# Patient Record
Sex: Male | Born: 1980 | Race: Black or African American | Hispanic: No | Marital: Single | Smoking: Current every day smoker
Health system: Southern US, Community
[De-identification: ages and names within clinical notes are randomized; demographics above are authoritative.]

## PROBLEM LIST (undated history)

## (undated) DIAGNOSIS — R7303 Prediabetes: Secondary | ICD-10-CM

## (undated) DIAGNOSIS — K219 Gastro-esophageal reflux disease without esophagitis: Secondary | ICD-10-CM

## (undated) DIAGNOSIS — Z973 Presence of spectacles and contact lenses: Secondary | ICD-10-CM

## (undated) DIAGNOSIS — J329 Chronic sinusitis, unspecified: Secondary | ICD-10-CM

## (undated) HISTORY — DX: Prediabetes: R73.03

## (undated) HISTORY — PX: WISDOM TOOTH EXTRACTION: SHX21

---
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
## 2000-06-14 ENCOUNTER — Emergency Department (HOSPITAL_COMMUNITY): Admission: EM | Admit: 2000-06-14 | Discharge: 2000-06-14 | Payer: Self-pay | Admitting: Emergency Medicine

## 2004-04-28 ENCOUNTER — Encounter: Admission: RE | Admit: 2004-04-28 | Discharge: 2004-04-28 | Payer: Self-pay | Admitting: Internal Medicine

## 2004-09-23 ENCOUNTER — Ambulatory Visit: Payer: Self-pay | Admitting: Internal Medicine

## 2005-09-19 ENCOUNTER — Ambulatory Visit: Payer: Self-pay | Admitting: Hospitalist

## 2005-09-20 ENCOUNTER — Ambulatory Visit: Payer: Self-pay | Admitting: Internal Medicine

## 2005-12-13 ENCOUNTER — Emergency Department (HOSPITAL_COMMUNITY): Admission: EM | Admit: 2005-12-13 | Discharge: 2005-12-13 | Payer: Self-pay | Admitting: Family Medicine

## 2006-12-14 ENCOUNTER — Emergency Department (HOSPITAL_COMMUNITY): Admission: EM | Admit: 2006-12-14 | Discharge: 2006-12-14 | Payer: Self-pay | Admitting: Family Medicine

## 2007-02-25 ENCOUNTER — Emergency Department (HOSPITAL_COMMUNITY): Admission: EM | Admit: 2007-02-25 | Discharge: 2007-02-25 | Payer: Self-pay | Admitting: Family Medicine

## 2007-05-23 ENCOUNTER — Emergency Department (HOSPITAL_COMMUNITY): Admission: EM | Admit: 2007-05-23 | Discharge: 2007-05-23 | Payer: Self-pay | Admitting: Family Medicine

## 2010-02-02 ENCOUNTER — Emergency Department (HOSPITAL_COMMUNITY): Admission: EM | Admit: 2010-02-02 | Discharge: 2010-02-02 | Payer: Self-pay | Admitting: Emergency Medicine

## 2010-03-17 ENCOUNTER — Ambulatory Visit: Payer: Self-pay | Admitting: Internal Medicine

## 2010-03-17 ENCOUNTER — Encounter: Payer: Self-pay | Admitting: Internal Medicine

## 2010-03-17 DIAGNOSIS — F17200 Nicotine dependence, unspecified, uncomplicated: Secondary | ICD-10-CM | POA: Insufficient documentation

## 2010-03-17 DIAGNOSIS — M7660 Achilles tendinitis, unspecified leg: Secondary | ICD-10-CM | POA: Insufficient documentation

## 2010-03-17 DIAGNOSIS — Z720 Tobacco use: Secondary | ICD-10-CM | POA: Insufficient documentation

## 2010-03-17 DIAGNOSIS — M79609 Pain in unspecified limb: Secondary | ICD-10-CM | POA: Insufficient documentation

## 2010-03-17 LAB — CONVERTED CEMR LAB
Chlamydia, Swab/Urine, PCR: NEGATIVE
GC Probe Amp, Urine: NEGATIVE
HCV Ab: NEGATIVE
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
## 2010-12-22 ENCOUNTER — Emergency Department (HOSPITAL_COMMUNITY): Payer: Self-pay

## 2010-12-22 ENCOUNTER — Emergency Department (HOSPITAL_COMMUNITY)
Admission: EM | Admit: 2010-12-22 | Discharge: 2010-12-22 | Disposition: A | Discharge status: Home / Self Care | Payer: Self-pay | Attending: Emergency Medicine | Admitting: Emergency Medicine

## 2010-12-22 ENCOUNTER — Encounter (HOSPITAL_COMMUNITY): Payer: Self-pay | Admitting: Radiology

## 2010-12-22 DIAGNOSIS — R29810 Facial weakness: Secondary | ICD-10-CM | POA: Insufficient documentation

## 2010-12-22 DIAGNOSIS — H538 Other visual disturbances: Secondary | ICD-10-CM | POA: Insufficient documentation

## 2010-12-22 DIAGNOSIS — G510 Bell's palsy: Secondary | ICD-10-CM | POA: Insufficient documentation

## 2010-12-22 DIAGNOSIS — J3489 Other specified disorders of nose and nasal sinuses: Secondary | ICD-10-CM | POA: Insufficient documentation

## 2010-12-22 DIAGNOSIS — R51 Headache: Secondary | ICD-10-CM | POA: Insufficient documentation

## 2010-12-22 DIAGNOSIS — R110 Nausea: Secondary | ICD-10-CM | POA: Insufficient documentation

## 2010-12-22 DIAGNOSIS — R4789 Other speech disturbances: Secondary | ICD-10-CM | POA: Insufficient documentation

## 2010-12-22 DIAGNOSIS — I10 Essential (primary) hypertension: Secondary | ICD-10-CM | POA: Insufficient documentation

## 2010-12-22 DIAGNOSIS — R42 Dizziness and giddiness: Secondary | ICD-10-CM | POA: Insufficient documentation

## 2010-12-22 DIAGNOSIS — R070 Pain in throat: Secondary | ICD-10-CM | POA: Insufficient documentation

## 2010-12-22 DIAGNOSIS — H5789 Other specified disorders of eye and adnexa: Secondary | ICD-10-CM | POA: Insufficient documentation

## 2010-12-22 DIAGNOSIS — R5381 Other malaise: Secondary | ICD-10-CM | POA: Insufficient documentation

## 2010-12-22 LAB — POCT I-STAT, CHEM 8
BUN: 22 mg/dL (ref 6–23)
Calcium, Ion: 1.07 mmol/L — ABNORMAL LOW (ref 1.12–1.32)
Chloride: 108 mEq/L (ref 96–112)
Creatinine, Ser: 1.3 mg/dL (ref 0.4–1.5)
Glucose, Bld: 97 mg/dL (ref 70–99)
HCT: 48 % (ref 39.0–52.0)
Hemoglobin: 16.3 g/dL (ref 13.0–17.0)
Potassium: 4 mEq/L (ref 3.5–5.1)
Sodium: 139 mEq/L (ref 135–145)
TCO2: 22 mmol/L (ref 0–100)

## 2010-12-24 ENCOUNTER — Telehealth: Payer: Self-pay | Admitting: *Deleted

## 2010-12-24 NOTE — Telephone Encounter (Signed)
According to ED notes pt was given prednisone 20 mg  3 tabs daily for 7 days. Should I tell him not to worry about the antibiotic?

## 2010-12-24 NOTE — Telephone Encounter (Signed)
Pt states he was seen in ED on 2/1 and treated for Bell's Palsy.  He was given  valacyclovir 500 mg  # 42 He can't afford this as cost is over $200.  Can you give him anything else? [facility] [address].

## 2010-12-24 NOTE — Telephone Encounter (Signed)
He needs to take the prednisone. If severe facial palsy, should try to get the valcylovir. If eye involvement, needs eye care - taping, eye drops, etc.

## 2010-12-24 NOTE — Telephone Encounter (Signed)
Reviewed the UpToDate article. Valacyclovir was the only recommended antiviral. Acylovir was tested but not beneficial. Antivirals only recom with rather severe facial weakness. Was he Rx steroids? That is the most beneficial tx.

## 2010-12-24 NOTE — Telephone Encounter (Signed)
Pt has been informed. He is seeing neurologist for f/u and will make appointment to come into clinic. He was given eye drops.

## 2013-06-26 ENCOUNTER — Encounter (HOSPITAL_COMMUNITY): Payer: Self-pay | Admitting: *Deleted

## 2013-06-26 ENCOUNTER — Emergency Department (HOSPITAL_COMMUNITY)
Admission: EM | Admit: 2013-06-26 | Discharge: 2013-06-26 | Disposition: A | Discharge status: Home / Self Care | Payer: Self-pay | Source: Home / Self Care

## 2013-06-26 DIAGNOSIS — B86 Scabies: Secondary | ICD-10-CM

## 2013-06-26 MED ORDER — HYDROXYZINE HCL 25 MG PO TABS: 25.0000 mg | ORAL_TABLET | Freq: Three times a day (TID) | ORAL | Status: DC | PRN

## 2013-06-26 MED ORDER — PERMETHRIN 5 % EX CREA: TOPICAL_CREAM | CUTANEOUS | Status: DC

## 2013-06-26 MED ORDER — TRIAMCINOLONE ACETONIDE 0.1 % EX CREA: TOPICAL_CREAM | Freq: Two times a day (BID) | CUTANEOUS | Status: DC

## 2013-06-26 NOTE — ED Notes (Signed)
Pt       Reports     Symptoms     Of a  Rash        For  About  1   Month     The    Rash  Is  On  His  Hands  And  The  Rash  Itches       He  Displays  No  Angioedema  And  Is  In no  Acute  Distress

## 2015-11-10 ENCOUNTER — Encounter (HOSPITAL_COMMUNITY): Payer: Self-pay | Admitting: Emergency Medicine

## 2015-11-10 ENCOUNTER — Emergency Department (HOSPITAL_COMMUNITY)
Admission: EM | Admit: 2015-11-10 | Discharge: 2015-11-10 | Disposition: A | Discharge status: Home / Self Care | Payer: 59 | Attending: Emergency Medicine | Admitting: Emergency Medicine

## 2015-11-10 DIAGNOSIS — S4992XA Unspecified injury of left shoulder and upper arm, initial encounter: Secondary | ICD-10-CM | POA: Insufficient documentation

## 2015-11-10 DIAGNOSIS — S199XXA Unspecified injury of neck, initial encounter: Secondary | ICD-10-CM | POA: Insufficient documentation

## 2015-11-10 DIAGNOSIS — S299XXA Unspecified injury of thorax, initial encounter: Secondary | ICD-10-CM | POA: Insufficient documentation

## 2015-11-10 DIAGNOSIS — T148 Other injury of unspecified body region: Secondary | ICD-10-CM | POA: Insufficient documentation

## 2015-11-10 DIAGNOSIS — F17210 Nicotine dependence, cigarettes, uncomplicated: Secondary | ICD-10-CM | POA: Diagnosis not present

## 2015-11-10 DIAGNOSIS — R091 Pleurisy: Secondary | ICD-10-CM | POA: Diagnosis present

## 2015-11-10 DIAGNOSIS — Y9289 Other specified places as the place of occurrence of the external cause: Secondary | ICD-10-CM | POA: Insufficient documentation

## 2015-11-10 DIAGNOSIS — X58XXXA Exposure to other specified factors, initial encounter: Secondary | ICD-10-CM | POA: Diagnosis not present

## 2015-11-10 DIAGNOSIS — S4991XA Unspecified injury of right shoulder and upper arm, initial encounter: Secondary | ICD-10-CM | POA: Insufficient documentation

## 2015-11-10 DIAGNOSIS — Y9389 Activity, other specified: Secondary | ICD-10-CM | POA: Diagnosis not present

## 2015-11-10 DIAGNOSIS — Y998 Other external cause status: Secondary | ICD-10-CM | POA: Diagnosis not present

## 2015-11-10 DIAGNOSIS — T148XXA Other injury of unspecified body region, initial encounter: Secondary | ICD-10-CM

## 2015-11-10 LAB — CK: CK TOTAL: 190 U/L (ref 49–397)

## 2015-11-10 MED ORDER — OXYCODONE-ACETAMINOPHEN 5-325 MG PO TABS
1.0000 | ORAL_TABLET | Freq: Once | ORAL | Status: AC
  Administered 2015-11-10: 1 via ORAL
  Filled 2015-11-10: qty 1

## 2015-11-10 MED ORDER — OXYCODONE-ACETAMINOPHEN 5-325 MG PO TABS: 1.0000 | ORAL_TABLET | Freq: Four times a day (QID) | ORAL | Status: DC | PRN

## 2015-11-10 MED ORDER — CYCLOBENZAPRINE HCL 10 MG PO TABS: 10.0000 mg | ORAL_TABLET | Freq: Two times a day (BID) | ORAL | Status: DC | PRN

## 2015-11-10 MED ORDER — IBUPROFEN 600 MG PO TABS: 600.0000 mg | ORAL_TABLET | Freq: Four times a day (QID) | ORAL | Status: DC | PRN

## 2015-11-10 NOTE — ED Notes (Addendum)
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 Entire upper chest and bilateral shoulder pain worse when woke up this morning. Tender to palpation; worse with movement. 7/10 pain. Heating pads applied. Smoker; denies other medical issues.

## 2015-11-10 NOTE — Discharge Instructions (Signed)
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
## 2016-09-30 ENCOUNTER — Ambulatory Visit (HOSPITAL_COMMUNITY)
Admission: EM | Admit: 2016-09-30 | Discharge: 2016-09-30 | Disposition: A | Discharge status: Home / Self Care | Payer: 59 | Attending: Emergency Medicine | Admitting: Emergency Medicine

## 2016-09-30 ENCOUNTER — Encounter (HOSPITAL_COMMUNITY): Payer: Self-pay | Admitting: Emergency Medicine

## 2016-09-30 DIAGNOSIS — B349 Viral infection, unspecified: Secondary | ICD-10-CM

## 2016-09-30 NOTE — ED Triage Notes (Signed)
Pt reports he woke up yest feeling fatigue, LH, dizziness, tremors, chills, BA, HA  States he has not been able to go to work due to sx since yest  A&O x4... NAD

## 2016-09-30 NOTE — ED Provider Notes (Signed)
CSN: [id number]     Arrival date & time 09/30/16  1413 History   None    Chief Complaint  Patient presents with  . URI    Pt presents with c/o 24 hours of viral type symptoms including chills, malaise, intermittent nausea, sinus congestion and h/a. Admits that his child have been ill with similar symptoms. No known fever, cough, chest pain, vomiting or diarrhea. States he felt so bad yesterday he was unable to go to work and will need a work note to return to work on Monday.       History reviewed. No pertinent past medical history. History reviewed. No pertinent surgical history. History reviewed. No pertinent family history. Social History  Substance Use Topics  . Smoking status: Current Every Day Smoker    Packs/day: 1.00    Types: Cigarettes  . Smokeless tobacco: Never Used  . Alcohol use Yes    Review of Systems  Constitutional: Positive for activity change, chills and fatigue. Negative for appetite change, diaphoresis and fever.  HENT: Positive for congestion. Negative for ear pain.   Eyes: Negative.   Respiratory: Negative for cough.   Gastrointestinal: Positive for nausea. Negative for diarrhea and vomiting.  Genitourinary: Negative for dysuria.  Musculoskeletal: Negative.   Skin: Negative.   Allergic/Immunologic: Negative.   Psychiatric/Behavioral: Negative.     Allergies  Patient has no known allergies.  Home Medications   Prior to Admission medications   Medication Sig Start Date End Date Taking? Authorizing Provider  acetaminophen (TYLENOL) 325 MG tablet Take 650 mg by mouth every 6 (six) hours as needed (pain).    Historical Provider, MD  cyclobenzaprine (FLEXERIL) 10 MG tablet Take 1 tablet (10 mg total) by mouth 2 (two) times daily as needed for muscle spasms. 11/10/15   Davonna Belling, MD  ibuprofen (ADVIL,MOTRIN) 600 MG tablet Take 1 tablet (600 mg total) by mouth every 6 (six) hours as needed. 11/10/15   Davonna Belling, MD   oxyCODONE-acetaminophen (PERCOCET/ROXICET) 5-325 MG tablet Take 1-2 tablets by mouth every 6 (six) hours as needed for severe pain. 11/10/15   Davonna Belling, MD   Meds Ordered and Administered this Visit  Medications - No data to display  There were no vitals taken for this visit. No data found.   Physical Exam  Constitutional: He is oriented to person, place, and time. He appears well-developed and well-nourished.  HENT:  Head: Normocephalic.  Right Ear: Tympanic membrane normal.  Left Ear: Tympanic membrane normal.  Nose: Nose normal. Right sinus exhibits no maxillary sinus tenderness and no frontal sinus tenderness. Left sinus exhibits no maxillary sinus tenderness and no frontal sinus tenderness.  Mouth/Throat: Uvula is midline, oropharynx is clear and moist and mucous membranes are normal.  Cardiovascular: Normal rate and regular rhythm.   Pulmonary/Chest: Effort normal and breath sounds normal.  Neurological: He is alert and oriented to person, place, and time.  Skin: Skin is warm and dry.  Psychiatric: He has a normal mood and affect.    Urgent Care Course   Clinical Course     Procedures (including critical care time)  Labs Review Labs Reviewed - No data to display  Imaging Review No results found.   Visual Acuity Review  Right Eye Distance:   Left Eye Distance:   Bilateral Distance:    Right Eye Near:   Left Eye Near:    Bilateral Near:         MDM   1. Viral syndrome   PE  unremarkable. Home care discussed and provided in print. Work note provided.    Jeryl Columbia, NP 09/30/16 1624

## 2017-10-07 ENCOUNTER — Encounter (HOSPITAL_COMMUNITY): Payer: Self-pay | Admitting: Emergency Medicine

## 2017-10-07 ENCOUNTER — Emergency Department (HOSPITAL_COMMUNITY): Payer: 59

## 2017-10-07 ENCOUNTER — Emergency Department (HOSPITAL_COMMUNITY)
Admission: EM | Admit: 2017-10-07 | Discharge: 2017-10-07 | Disposition: A | Discharge status: Home / Self Care | Payer: 59 | Attending: Emergency Medicine | Admitting: Emergency Medicine

## 2017-10-07 DIAGNOSIS — Y9389 Activity, other specified: Secondary | ICD-10-CM | POA: Diagnosis not present

## 2017-10-07 DIAGNOSIS — Y999 Unspecified external cause status: Secondary | ICD-10-CM | POA: Insufficient documentation

## 2017-10-07 DIAGNOSIS — F17210 Nicotine dependence, cigarettes, uncomplicated: Secondary | ICD-10-CM | POA: Diagnosis not present

## 2017-10-07 DIAGNOSIS — Z79899 Other long term (current) drug therapy: Secondary | ICD-10-CM | POA: Diagnosis not present

## 2017-10-07 DIAGNOSIS — Y929 Unspecified place or not applicable: Secondary | ICD-10-CM | POA: Insufficient documentation

## 2017-10-07 DIAGNOSIS — S161XXA Strain of muscle, fascia and tendon at neck level, initial encounter: Secondary | ICD-10-CM

## 2017-10-07 MED ORDER — DICLOFENAC SODIUM 50 MG PO TBEC: 50.0000 mg | DELAYED_RELEASE_TABLET | Freq: Two times a day (BID) | ORAL | 0 refills | Status: DC

## 2017-10-07 MED ORDER — CYCLOBENZAPRINE HCL 10 MG PO TABS: 10.0000 mg | ORAL_TABLET | Freq: Two times a day (BID) | ORAL | 0 refills | Status: DC | PRN
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
## 2017-10-07 NOTE — Discharge Instructions (Signed)
Do not take the muscle relaxant while driving as it will make you sleepy. Follow up with your doctor or return here for worsening symptoms.
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
## 2018-03-07 ENCOUNTER — Encounter (HOSPITAL_COMMUNITY): Payer: Self-pay | Admitting: Family Medicine

## 2018-03-07 ENCOUNTER — Ambulatory Visit (HOSPITAL_COMMUNITY)
Admission: EM | Admit: 2018-03-07 | Discharge: 2018-03-07 | Disposition: A | Discharge status: Home / Self Care | Payer: 59 | Attending: Family Medicine | Admitting: Family Medicine

## 2018-03-07 ENCOUNTER — Other Ambulatory Visit: Payer: Self-pay

## 2018-03-07 DIAGNOSIS — G501 Atypical facial pain: Secondary | ICD-10-CM | POA: Diagnosis not present

## 2018-03-07 DIAGNOSIS — R51 Headache: Secondary | ICD-10-CM

## 2018-03-07 MED ORDER — PENICILLIN V POTASSIUM 500 MG PO TABS: 500.0000 mg | ORAL_TABLET | Freq: Three times a day (TID) | ORAL | 0 refills | Status: DC

## 2018-03-07 MED ORDER — HYDROCODONE-ACETAMINOPHEN 5-325 MG PO TABS: 1.0000 | ORAL_TABLET | Freq: Four times a day (QID) | ORAL | 0 refills | Status: DC | PRN

## 2018-03-07 NOTE — ED Triage Notes (Signed)
Left facial pain and head pain that started one month ago.  Pain worsened significantly today.  Patient reports having "deep cleaning" at dentist twice over the past month.  Patient spoke to dental office today, scheduled appt for next week.

## 2018-03-07 NOTE — ED Provider Notes (Signed)
[facility]   [id number] 03/07/18 Arrival Time: 1429   SUBJECTIVE:  Rodney Johnson is a 37 y.o. male who presents to the urgent care with complaint of Left facial pain and head pain that started one month ago.  Pain worsened significantly today.  Patient reports having "deep cleaning" at dentist twice over the past month.  Patient spoke to dental office today, scheduled appt for next week.    Pain was initially felt on right face but subsequently shifted to left face.  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  Pain is throbbing.  History reviewed. No pertinent past medical history. Family History  Problem Relation Age of Onset  . Cancer Father   . Diabetes Father    Social History   Socioeconomic History  . Marital status: Single    Spouse name: Not on file  . Number of children: Not on file  . Years of education: Not on file  . Highest education level: Not on file  Occupational History  . Not on file  Social Needs  . Financial resource strain: Not on file  . Food insecurity:    Worry: Not on file    Inability: Not on file  . Transportation needs:    Medical: Not on file    Non-medical: Not on file  Tobacco Use  . Smoking status: Current Every Day Smoker    Packs/day: 1.00    Types: Cigarettes  . Smokeless tobacco: Never Used  Substance and Sexual Activity  . Alcohol use: Yes  . Drug use: Yes    Types: Marijuana  . Sexual activity: Not on file  Lifestyle  . Physical activity:    Days per week: Not on file    Minutes per session: Not on file  . Stress: Not on file  Relationships  . Social connections:    Talks on phone: Not on file    Gets together: Not on file    Attends religious service: Not on file    Active member of club or organization: Not on file    Attends meetings of clubs or organizations: Not on file    Relationship status: Not on file  . Intimate partner violence:    Fear of current or ex partner: Not on file   Emotionally abused: Not on file    Physically abused: Not on file    Forced sexual activity: Not on file  Other Topics Concern  . Not on file  Social History Narrative  . Not on file   No outpatient medications have been marked as taking for the 03/07/18 encounter [facility] Encounter).   No Known Allergies    ROS: As per HPI, remainder of ROS negative.   OBJECTIVE:   Vitals:   03/07/18 1459  BP: 123/69  Pulse: 80  Resp: 18  Temp: 99.2 F (37.3 C)  TempSrc: Oral  SpO2: 97%     General appearance: alert; no distress Eyes: PERRL; EOMI; conjunctiva normal HENT: normocephalic; atraumatic; TMs normal, canal normal, external ears normal without trauma; nasal mucosa normal; oral mucosa mild gingival erythema Neck: supple Back: no CVA tenderness Extremities: no cyanosis or edema; symmetrical with no gross deformities Skin: warm and dry Neurologic: normal gait; grossly normal Psychological: alert and cooperative; normal mood and affect      Labs:  Results for orders placed or performed during the hospital encounter of 11/10/15  CK  Result Value Ref Range   Total CK 190 49 -  397 U/L    Labs Reviewed - No data to display  No results found.     ASSESSMENT & PLAN:  No diagnosis found. Keep appointment with dentist next week. Meds ordered this encounter  Medications  . penicillin v potassium (VEETID) 500 MG tablet    Sig: Take 1 tablet (500 mg total) by mouth 3 (three) times daily.    Dispense:  30 tablet    Refill:  0  . HYDROcodone-acetaminophen (NORCO) 5-325 MG tablet    Sig: Take 1 tablet by mouth every 6 (six) hours as needed for moderate pain.    Dispense:  10 tablet    Refill:  0    Reviewed expectations re: course of current medical issues. Questions answered. Outlined signs and symptoms indicating need for more acute intervention. Patient verbalized understanding. After Visit Summary given.    Procedures:  I checked with the [place]  substance abuse log and patient's name did not show up      Robyn Haber, MD 03/07/18 1516

## 2018-03-07 NOTE — Discharge Instructions (Addendum)
Keep appointment with dentist next week.

## 2018-03-08 DIAGNOSIS — K0889 Other specified disorders of teeth and supporting structures: Secondary | ICD-10-CM | POA: Insufficient documentation

## 2018-03-08 DIAGNOSIS — J0100 Acute maxillary sinusitis, unspecified: Secondary | ICD-10-CM | POA: Insufficient documentation

## 2018-03-08 DIAGNOSIS — R51 Headache: Secondary | ICD-10-CM | POA: Diagnosis present

## 2018-03-08 DIAGNOSIS — D164 Benign neoplasm of bones of skull and face: Secondary | ICD-10-CM | POA: Insufficient documentation

## 2018-03-08 DIAGNOSIS — Z79899 Other long term (current) drug therapy: Secondary | ICD-10-CM | POA: Insufficient documentation

## 2018-03-08 DIAGNOSIS — F17210 Nicotine dependence, cigarettes, uncomplicated: Secondary | ICD-10-CM | POA: Insufficient documentation

## 2018-03-09 ENCOUNTER — Encounter (HOSPITAL_COMMUNITY): Payer: Self-pay

## 2018-03-09 ENCOUNTER — Emergency Department (HOSPITAL_COMMUNITY)
Admission: EM | Admit: 2018-03-09 | Discharge: 2018-03-09 | Disposition: A | Discharge status: Home / Self Care | Payer: 59 | Attending: Emergency Medicine | Admitting: Emergency Medicine

## 2018-03-09 ENCOUNTER — Emergency Department (HOSPITAL_COMMUNITY): Payer: 59

## 2018-03-09 ENCOUNTER — Other Ambulatory Visit: Payer: Self-pay

## 2018-03-09 DIAGNOSIS — J0100 Acute maxillary sinusitis, unspecified: Secondary | ICD-10-CM

## 2018-03-09 MED ORDER — FENTANYL CITRATE (PF) 100 MCG/2ML IJ SOLN
50.0000 ug | Freq: Once | INTRAMUSCULAR | Status: AC
  Administered 2018-03-09: 50 ug via INTRAVENOUS
  Filled 2018-03-09: qty 2

## 2018-03-09 MED ORDER — IOHEXOL 300 MG/ML  SOLN
75.0000 mL | Freq: Once | INTRAMUSCULAR | Status: AC | PRN
  Administered 2018-03-09: 75 mL via INTRAVENOUS

## 2018-03-09 MED ORDER — AMOXICILLIN-POT CLAVULANATE 875-125 MG PO TABS: ORAL_TABLET | ORAL | 0 refills | Status: DC

## 2018-03-09 MED ORDER — ONDANSETRON HCL 4 MG/2ML IJ SOLN
4.0000 mg | Freq: Once | INTRAMUSCULAR | Status: AC
  Administered 2018-03-09: 4 mg via INTRAVENOUS
  Filled 2018-03-09: qty 2
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
## 2018-03-09 NOTE — ED Triage Notes (Signed)
Pt reports L sided facial pain behind his L eye and down to the top of his L jaw. He states that he was seen yesterday for same. Reports that he was given penicillin and it has not helped and that the pain has actually worsened. A&Ox4.

## 2018-09-20 ENCOUNTER — Ambulatory Visit (HOSPITAL_COMMUNITY)
Admission: EM | Admit: 2018-09-20 | Discharge: 2018-09-20 | Disposition: A | Discharge status: Home / Self Care | Payer: 59 | Attending: Family Medicine | Admitting: Family Medicine

## 2018-09-20 ENCOUNTER — Other Ambulatory Visit: Payer: Self-pay

## 2018-09-20 ENCOUNTER — Encounter (HOSPITAL_COMMUNITY): Payer: Self-pay | Admitting: *Deleted

## 2018-09-20 DIAGNOSIS — J0100 Acute maxillary sinusitis, unspecified: Secondary | ICD-10-CM | POA: Diagnosis not present

## 2018-09-20 MED ORDER — AMOXICILLIN-POT CLAVULANATE 875-125 MG PO TABS: 1.0000 | ORAL_TABLET | Freq: Two times a day (BID) | ORAL | 0 refills | Status: AC

## 2018-09-20 MED ORDER — CETIRIZINE-PSEUDOEPHEDRINE ER 5-120 MG PO TB12: 1.0000 | ORAL_TABLET | Freq: Every day | ORAL | 0 refills | Status: DC

## 2018-09-20 NOTE — ED Triage Notes (Signed)
C/o facial pain states his sinus are infected.
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
## 2018-09-20 NOTE — Discharge Instructions (Addendum)
Rest and push fluids Zyrtec D prescribed.  Use daily for symptomatic relief Augmentin prescribed.  Take as directed and to completion Continue with OTC ibuprofen/tylenol as needed for pain Follow up with PCP or [facility] if symptoms persists Return or go to the ED if you have any new or worsening symptoms such as fever, chills, worsening sinus pain/pressure, cough, sore throat, chest pain, shortness of breath, abdominal pain, changes in bowel or bladder habits, etc..Marland Kitchen

## 2018-12-24 ENCOUNTER — Encounter (HOSPITAL_BASED_OUTPATIENT_CLINIC_OR_DEPARTMENT_OTHER): Payer: Self-pay

## 2018-12-24 ENCOUNTER — Emergency Department (HOSPITAL_BASED_OUTPATIENT_CLINIC_OR_DEPARTMENT_OTHER): Payer: 59

## 2018-12-24 ENCOUNTER — Encounter (HOSPITAL_COMMUNITY): Payer: Self-pay

## 2018-12-24 ENCOUNTER — Emergency Department (HOSPITAL_BASED_OUTPATIENT_CLINIC_OR_DEPARTMENT_OTHER)
Admission: EM | Admit: 2018-12-24 | Discharge: 2018-12-25 | Disposition: A | Discharge status: Home / Self Care | Payer: 59 | Source: Home / Self Care | Attending: Emergency Medicine | Admitting: Emergency Medicine

## 2018-12-24 ENCOUNTER — Other Ambulatory Visit: Payer: Self-pay

## 2018-12-24 ENCOUNTER — Emergency Department (HOSPITAL_COMMUNITY)
Admission: EM | Admit: 2018-12-24 | Discharge: 2018-12-24 | Disposition: A | Discharge status: Home / Self Care | Payer: 59 | Attending: Emergency Medicine | Admitting: Emergency Medicine

## 2018-12-24 DIAGNOSIS — Z5321 Procedure and treatment not carried out due to patient leaving prior to being seen by health care provider: Secondary | ICD-10-CM | POA: Diagnosis not present

## 2018-12-24 DIAGNOSIS — Z79899 Other long term (current) drug therapy: Secondary | ICD-10-CM | POA: Insufficient documentation

## 2018-12-24 DIAGNOSIS — F17210 Nicotine dependence, cigarettes, uncomplicated: Secondary | ICD-10-CM

## 2018-12-24 DIAGNOSIS — J329 Chronic sinusitis, unspecified: Secondary | ICD-10-CM | POA: Insufficient documentation

## 2018-12-24 DIAGNOSIS — R51 Headache: Secondary | ICD-10-CM | POA: Insufficient documentation

## 2018-12-24 LAB — CBC WITH DIFFERENTIAL/PLATELET
ABS IMMATURE GRANULOCYTES: 0.04 10*3/uL (ref 0.00–0.07)
Basophils Absolute: 0.1 10*3/uL (ref 0.0–0.1)
Basophils Relative: 1 %
EOS PCT: 5 %
Eosinophils Absolute: 0.6 10*3/uL — ABNORMAL HIGH (ref 0.0–0.5)
HCT: 46.1 % (ref 39.0–52.0)
HEMOGLOBIN: 14.9 g/dL (ref 13.0–17.0)
Immature Granulocytes: 0 %
LYMPHS ABS: 4 10*3/uL (ref 0.7–4.0)
LYMPHS PCT: 31 %
MCH: 28.5 pg (ref 26.0–34.0)
MCHC: 32.3 g/dL (ref 30.0–36.0)
MCV: 88.3 fL (ref 80.0–100.0)
MONOS PCT: 8 %
Monocytes Absolute: 1 10*3/uL (ref 0.1–1.0)
NEUTROS ABS: 7.2 10*3/uL (ref 1.7–7.7)
Neutrophils Relative %: 55 %
Platelets: 351 10*3/uL (ref 150–400)
RBC: 5.22 MIL/uL (ref 4.22–5.81)
RDW: 12.6 % (ref 11.5–15.5)
WBC: 12.9 10*3/uL — AB (ref 4.0–10.5)
nRBC: 0 % (ref 0.0–0.2)

## 2018-12-24 LAB — BASIC METABOLIC PANEL
Anion gap: 7 (ref 5–15)
BUN: 12 mg/dL (ref 6–20)
CHLORIDE: 102 mmol/L (ref 98–111)
CO2: 23 mmol/L (ref 22–32)
CREATININE: 0.97 mg/dL (ref 0.61–1.24)
Calcium: 8.5 mg/dL — ABNORMAL LOW (ref 8.9–10.3)
GFR calc Af Amer: >60 mL/min (ref >60)
GFR calc non Af Amer: >60 mL/min (ref >60)
GLUCOSE: 114 mg/dL — AB (ref 70–99)
POTASSIUM: 3.5 mmol/L (ref 3.5–5.1)
Sodium: 132 mmol/L — ABNORMAL LOW (ref 135–145)

## 2018-12-24 MED ORDER — FENTANYL CITRATE (PF) 100 MCG/2ML IJ SOLN
50.0000 ug | Freq: Once | INTRAMUSCULAR | Status: AC
  Administered 2018-12-24: 50 ug via INTRAVENOUS
  Filled 2018-12-24: qty 2

## 2018-12-24 NOTE — ED Notes (Signed)
Waiting for CT
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
## 2018-12-24 NOTE — ED Triage Notes (Signed)
C/o pain to left forehead and top of head x "90-120 days"-states he was seen at [facility] and [facility] ED for same-LWBS at [facility] ED just PTA-NAD-steady gait

## 2018-12-24 NOTE — ED Triage Notes (Signed)
Patient c/o headache. Patient states that he had a sinus infection 4 months ago and has been having intermittent headaches since.

## 2018-12-25 MED ORDER — IOPAMIDOL (ISOVUE-300) INJECTION 61%
100.0000 mL | Freq: Once | INTRAVENOUS | Status: AC | PRN
  Administered 2018-12-25: 100 mL via INTRAVENOUS

## 2018-12-25 MED ORDER — DOXYCYCLINE HYCLATE 100 MG PO CAPS: 100.0000 mg | ORAL_CAPSULE | Freq: Two times a day (BID) | ORAL | 0 refills | Status: DC

## 2018-12-25 MED ORDER — DOXYCYCLINE HYCLATE 100 MG PO TABS
100.0000 mg | ORAL_TABLET | Freq: Once | ORAL | Status: AC
  Administered 2018-12-25: 100 mg via ORAL
  Filled 2018-12-25: qty 1

## 2018-12-25 NOTE — Discharge Instructions (Addendum)
Your CT showed:  Severe LEFT paranasal sinusitis; obstructed LEFT frontoethmoidal recess and LEFT ostiomeatal unit.  Please call the ear nose and throat doctor to set up an appointment to see if there are any interventions to help with this. It is likely your headache will persist and worsen if you don't get this taken care of and the infection can spread affect your bone.

## 2018-12-25 NOTE — ED Notes (Signed)
Patient transported to CT 

## 2018-12-26 ENCOUNTER — Telehealth: Payer: Self-pay | Admitting: General Practice

## 2018-12-26 NOTE — Telephone Encounter (Signed)
LVM for pt call office so we can schedule new pt appt with either [facility] or [facility]. Pt requested it.

## 2019-06-25 DIAGNOSIS — J329 Chronic sinusitis, unspecified: Secondary | ICD-10-CM | POA: Insufficient documentation

## 2019-08-06 ENCOUNTER — Emergency Department (HOSPITAL_COMMUNITY)
Admission: EM | Admit: 2019-08-06 | Discharge: 2019-08-06 | Disposition: A | Discharge status: Home / Self Care | Payer: 59 | Attending: Emergency Medicine | Admitting: Emergency Medicine

## 2019-08-06 ENCOUNTER — Encounter (HOSPITAL_COMMUNITY): Payer: Self-pay

## 2019-08-06 ENCOUNTER — Other Ambulatory Visit: Payer: Self-pay

## 2019-08-06 DIAGNOSIS — F17210 Nicotine dependence, cigarettes, uncomplicated: Secondary | ICD-10-CM | POA: Diagnosis not present

## 2019-08-06 DIAGNOSIS — Z79899 Other long term (current) drug therapy: Secondary | ICD-10-CM | POA: Insufficient documentation

## 2019-08-06 DIAGNOSIS — R220 Localized swelling, mass and lump, head: Secondary | ICD-10-CM | POA: Diagnosis present

## 2019-08-06 DIAGNOSIS — J329 Chronic sinusitis, unspecified: Secondary | ICD-10-CM | POA: Diagnosis not present

## 2019-08-06 MED ORDER — DOXYCYCLINE HYCLATE 100 MG PO CAPS
100.0000 mg | ORAL_CAPSULE | Freq: Two times a day (BID) | ORAL | 0 refills | Status: AC
Start: 2019-08-06 — End: 2019-08-20

## 2019-08-06 NOTE — Discharge Instructions (Addendum)
Follow up with your ENT. Continue twice daily sinus rinse as directed by your ENT. Start probiotics like Activia yogurt and Culturelle. Take Doxycycline as prescribed and complete the full course.
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
## 2019-08-06 NOTE — ED Triage Notes (Signed)
Pt reports generalized facial swelling x 3 weeks. He reports that he has been diagnosed with a sinus infection on his L side. He states that since her ENT upped his steroid dose, he feels like his face has become more swollen. No airway compromise noted. Also still experiencing symptoms from the sinus infection.

## 2019-09-13 ENCOUNTER — Other Ambulatory Visit: Payer: Self-pay

## 2019-09-13 ENCOUNTER — Encounter (HOSPITAL_COMMUNITY): Payer: Self-pay

## 2019-09-13 DIAGNOSIS — F17210 Nicotine dependence, cigarettes, uncomplicated: Secondary | ICD-10-CM | POA: Diagnosis present

## 2019-09-13 DIAGNOSIS — J0101 Acute recurrent maxillary sinusitis: Secondary | ICD-10-CM | POA: Diagnosis present

## 2019-09-13 DIAGNOSIS — D169 Benign neoplasm of bone and articular cartilage, unspecified: Secondary | ICD-10-CM | POA: Diagnosis present

## 2019-09-13 DIAGNOSIS — L03213 Periorbital cellulitis: Principal | ICD-10-CM | POA: Diagnosis present

## 2019-09-13 DIAGNOSIS — Z833 Family history of diabetes mellitus: Secondary | ICD-10-CM

## 2019-09-13 DIAGNOSIS — Z809 Family history of malignant neoplasm, unspecified: Secondary | ICD-10-CM

## 2019-09-13 DIAGNOSIS — Z20828 Contact with and (suspected) exposure to other viral communicable diseases: Secondary | ICD-10-CM | POA: Diagnosis present

## 2019-09-13 DIAGNOSIS — J324 Chronic pansinusitis: Secondary | ICD-10-CM | POA: Diagnosis present

## 2019-09-13 DIAGNOSIS — G43909 Migraine, unspecified, not intractable, without status migrainosus: Secondary | ICD-10-CM | POA: Diagnosis present

## 2019-09-13 NOTE — ED Triage Notes (Signed)
Pt coming from home c/o left sided facial pain that has been going on for months without relief. Taken prescribed medication without relief. [facility]. Blurry vision and light sensitivity.

## 2019-09-14 ENCOUNTER — Emergency Department (HOSPITAL_COMMUNITY): Payer: 59

## 2019-09-14 ENCOUNTER — Emergency Department (HOSPITAL_COMMUNITY): Payer: 59 | Admitting: Certified Registered"

## 2019-09-14 ENCOUNTER — Inpatient Hospital Stay (HOSPITAL_COMMUNITY)
Admission: EM | Admit: 2019-09-14 | Discharge: 2019-09-17 | DRG: 983 | Disposition: A | Discharge status: Home / Self Care | Payer: 59 | Attending: Otolaryngology | Admitting: Otolaryngology

## 2019-09-14 ENCOUNTER — Encounter (HOSPITAL_COMMUNITY)
Admission: EM | Disposition: A | Discharge status: Home / Self Care | Payer: Self-pay | Source: Home / Self Care | Attending: Otolaryngology

## 2019-09-14 ENCOUNTER — Encounter (HOSPITAL_COMMUNITY): Payer: Self-pay

## 2019-09-14 DIAGNOSIS — F17210 Nicotine dependence, cigarettes, uncomplicated: Secondary | ICD-10-CM | POA: Diagnosis present

## 2019-09-14 DIAGNOSIS — L03213 Periorbital cellulitis: Secondary | ICD-10-CM | POA: Diagnosis present

## 2019-09-14 DIAGNOSIS — G43909 Migraine, unspecified, not intractable, without status migrainosus: Secondary | ICD-10-CM | POA: Diagnosis present

## 2019-09-14 DIAGNOSIS — J324 Chronic pansinusitis: Secondary | ICD-10-CM

## 2019-09-14 DIAGNOSIS — Z833 Family history of diabetes mellitus: Secondary | ICD-10-CM | POA: Diagnosis not present

## 2019-09-14 DIAGNOSIS — Z20828 Contact with and (suspected) exposure to other viral communicable diseases: Secondary | ICD-10-CM | POA: Diagnosis present

## 2019-09-14 DIAGNOSIS — D169 Benign neoplasm of bone and articular cartilage, unspecified: Secondary | ICD-10-CM | POA: Diagnosis present

## 2019-09-14 DIAGNOSIS — Z809 Family history of malignant neoplasm, unspecified: Secondary | ICD-10-CM | POA: Diagnosis not present

## 2019-09-14 DIAGNOSIS — H05012 Cellulitis of left orbit: Secondary | ICD-10-CM | POA: Diagnosis present

## 2019-09-14 DIAGNOSIS — J0101 Acute recurrent maxillary sinusitis: Secondary | ICD-10-CM | POA: Diagnosis present

## 2019-09-14 HISTORY — PX: NASAL SINUS SURGERY: SHX719

## 2019-09-14 LAB — CBC WITH DIFFERENTIAL/PLATELET
Abs Immature Granulocytes: 0.11 10*3/uL — ABNORMAL HIGH (ref 0.00–0.07)
Basophils Absolute: 0.1 10*3/uL (ref 0.0–0.1)
Basophils Relative: 1 %
Eosinophils Absolute: 0.4 10*3/uL (ref 0.0–0.5)
Eosinophils Relative: 4 %
HCT: 45.6 % (ref 39.0–52.0)
Hemoglobin: 14.8 g/dL (ref 13.0–17.0)
Immature Granulocytes: 1 %
Lymphocytes Relative: 26 %
Lymphs Abs: 3 10*3/uL (ref 0.7–4.0)
MCH: 29.2 pg (ref 26.0–34.0)
MCHC: 32.5 g/dL (ref 30.0–36.0)
MCV: 90.1 fL (ref 80.0–100.0)
Monocytes Absolute: 1.1 10*3/uL — ABNORMAL HIGH (ref 0.1–1.0)
Monocytes Relative: 9 %
Neutro Abs: 7.1 10*3/uL (ref 1.7–7.7)
Neutrophils Relative %: 59 %
Platelets: 335 10*3/uL (ref 150–400)
RBC: 5.06 MIL/uL (ref 4.22–5.81)
RDW: 12.5 % (ref 11.5–15.5)
WBC: 11.8 10*3/uL — ABNORMAL HIGH (ref 4.0–10.5)
nRBC: 0 % (ref 0.0–0.2)

## 2019-09-14 LAB — I-STAT CHEM 8, ED
BUN: 13 mg/dL (ref 6–20)
Calcium, Ion: 1.22 mmol/L (ref 1.15–1.40)
Chloride: 103 mmol/L (ref 98–111)
Creatinine, Ser: 0.8 mg/dL (ref 0.61–1.24)
Glucose, Bld: 90 mg/dL (ref 70–99)
HCT: 42 % (ref 39.0–52.0)
Hemoglobin: 14.3 g/dL (ref 13.0–17.0)
Potassium: 4.1 mmol/L (ref 3.5–5.1)
Sodium: 139 mmol/L (ref 135–145)
TCO2: 26 mmol/L (ref 22–32)

## 2019-09-14 LAB — SARS CORONAVIRUS 2 BY RT PCR (HOSPITAL ORDER, PERFORMED IN ~~LOC~~ HOSPITAL LAB): SARS Coronavirus 2: NEGATIVE

## 2019-09-14 SURGERY — SINUS SURGERY, ENDOSCOPIC
Anesthesia: General | Site: Face | Laterality: Bilateral

## 2019-09-14 MED ORDER — IOHEXOL 300 MG/ML  SOLN
100.0000 mL | Freq: Once | INTRAMUSCULAR | Status: AC | PRN
  Administered 2019-09-14: 100 mL via INTRAVENOUS

## 2019-09-14 MED ORDER — FENTANYL CITRATE (PF) 250 MCG/5ML IJ SOLN
INTRAMUSCULAR | Status: AC
  Filled 2019-09-14: qty 5

## 2019-09-14 MED ORDER — ONDANSETRON HCL 4 MG/2ML IJ SOLN
INTRAMUSCULAR | Status: AC
  Filled 2019-09-14: qty 2

## 2019-09-14 MED ORDER — KETOROLAC TROMETHAMINE 60 MG/2ML IM SOLN
60.0000 mg | Freq: Once | INTRAMUSCULAR | Status: AC
  Administered 2019-09-14: 60 mg via INTRAMUSCULAR
  Filled 2019-09-14: qty 2

## 2019-09-14 MED ORDER — PROMETHAZINE HCL 25 MG PO TABS: 25.0000 mg | ORAL_TABLET | Freq: Four times a day (QID) | ORAL | Status: DC | PRN

## 2019-09-14 MED ORDER — IBUPROFEN 100 MG/5ML PO SUSP
400.0000 mg | Freq: Four times a day (QID) | ORAL | Status: DC | PRN
  Administered 2019-09-14 – 2019-09-16 (×4): 400 mg via ORAL
  Filled 2019-09-14 (×4): qty 20

## 2019-09-14 MED ORDER — PROPOFOL 10 MG/ML IV BOLUS
INTRAVENOUS | Status: AC
  Filled 2019-09-14: qty 40

## 2019-09-14 MED ORDER — ONDANSETRON HCL 4 MG/2ML IJ SOLN
INTRAMUSCULAR | Status: DC | PRN
  Administered 2019-09-14: 4 mg via INTRAVENOUS

## 2019-09-14 MED ORDER — PROPOFOL 10 MG/ML IV BOLUS
INTRAVENOUS | Status: DC | PRN
  Administered 2019-09-14: 200 mg via INTRAVENOUS

## 2019-09-14 MED ORDER — LACTATED RINGERS IV SOLN
INTRAVENOUS | Status: DC
  Administered 2019-09-14 (×2): via INTRAVENOUS

## 2019-09-14 MED ORDER — FENTANYL CITRATE (PF) 100 MCG/2ML IJ SOLN: 25.0000 ug | INTRAMUSCULAR | Status: DC | PRN

## 2019-09-14 MED ORDER — SODIUM CHLORIDE 0.9 % IR SOLN
Status: DC | PRN
  Administered 2019-09-14: 1000 mL

## 2019-09-14 MED ORDER — SUCCINYLCHOLINE CHLORIDE 200 MG/10ML IV SOSY
PREFILLED_SYRINGE | INTRAVENOUS | Status: AC
  Filled 2019-09-14: qty 10

## 2019-09-14 MED ORDER — ESMOLOL HCL 100 MG/10ML IV SOLN
INTRAVENOUS | Status: DC | PRN
  Administered 2019-09-14 (×7): 20 mg via INTRAVENOUS

## 2019-09-14 MED ORDER — CLINDAMYCIN PHOSPHATE 600 MG/50ML IV SOLN
600.0000 mg | Freq: Once | INTRAVENOUS | Status: AC
  Administered 2019-09-14: 600 mg via INTRAVENOUS
  Filled 2019-09-14: qty 50

## 2019-09-14 MED ORDER — HYDROCODONE-ACETAMINOPHEN 5-325 MG PO TABS
1.0000 | ORAL_TABLET | ORAL | Status: DC | PRN
  Administered 2019-09-14 – 2019-09-17 (×13): 2 via ORAL
  Filled 2019-09-14 (×13): qty 2

## 2019-09-14 MED ORDER — 0.9 % SODIUM CHLORIDE (POUR BTL) OPTIME
TOPICAL | Status: DC | PRN
  Administered 2019-09-14: 1000 mL

## 2019-09-14 MED ORDER — MIDAZOLAM HCL 5 MG/5ML IJ SOLN
INTRAMUSCULAR | Status: DC | PRN
  Administered 2019-09-14: 2 mg via INTRAVENOUS

## 2019-09-14 MED ORDER — PROMETHAZINE HCL 25 MG/ML IJ SOLN: 6.2500 mg | INTRAMUSCULAR | Status: DC | PRN

## 2019-09-14 MED ORDER — DEXAMETHASONE SODIUM PHOSPHATE 10 MG/ML IJ SOLN
INTRAMUSCULAR | Status: AC
  Filled 2019-09-14: qty 1

## 2019-09-14 MED ORDER — LIDOCAINE-EPINEPHRINE 1 %-1:100000 IJ SOLN
INTRAMUSCULAR | Status: DC | PRN
  Administered 2019-09-14: 12 mL

## 2019-09-14 MED ORDER — BACITRACIN ZINC 500 UNIT/GM EX OINT
TOPICAL_OINTMENT | CUTANEOUS | Status: DC | PRN
  Administered 2019-09-14: 1 via TOPICAL

## 2019-09-14 MED ORDER — ESMOLOL HCL 100 MG/10ML IV SOLN
INTRAVENOUS | Status: AC
  Filled 2019-09-14: qty 10

## 2019-09-14 MED ORDER — LIDOCAINE 2% (20 MG/ML) 5 ML SYRINGE
INTRAMUSCULAR | Status: AC
  Filled 2019-09-14: qty 5

## 2019-09-14 MED ORDER — ARTIFICIAL TEARS OPHTHALMIC OINT
TOPICAL_OINTMENT | OPHTHALMIC | Status: DC | PRN
  Administered 2019-09-14: 1 via OPHTHALMIC

## 2019-09-14 MED ORDER — OXYMETAZOLINE HCL 0.05 % NA SOLN
2.0000 | NASAL | Status: DC
  Administered 2019-09-14: 2 via NASAL
  Filled 2019-09-14: qty 30

## 2019-09-14 MED ORDER — SUCCINYLCHOLINE CHLORIDE 200 MG/10ML IV SOSY
PREFILLED_SYRINGE | INTRAVENOUS | Status: DC | PRN
  Administered 2019-09-14: 100 mg via INTRAVENOUS

## 2019-09-14 MED ORDER — OXYMETAZOLINE HCL 0.05 % NA SOLN
NASAL | Status: DC | PRN
  Administered 2019-09-14: 2

## 2019-09-14 MED ORDER — DEXAMETHASONE SODIUM PHOSPHATE 10 MG/ML IJ SOLN
INTRAMUSCULAR | Status: DC | PRN
  Administered 2019-09-14: 10 mg via INTRAVENOUS

## 2019-09-14 MED ORDER — ARTIFICIAL TEARS OPHTHALMIC OINT
TOPICAL_OINTMENT | OPHTHALMIC | Status: AC
  Filled 2019-09-14: qty 3.5

## 2019-09-14 MED ORDER — DEXAMETHASONE SODIUM PHOSPHATE 10 MG/ML IJ SOLN
10.0000 mg | Freq: Three times a day (TID) | INTRAMUSCULAR | Status: DC
  Administered 2019-09-14 – 2019-09-16 (×6): 10 mg via INTRAVENOUS
  Filled 2019-09-14 (×8): qty 1

## 2019-09-14 MED ORDER — OXYMETAZOLINE HCL 0.05 % NA SOLN
NASAL | Status: AC
  Filled 2019-09-14: qty 30

## 2019-09-14 MED ORDER — DEXTROSE-NACL 5-0.9 % IV SOLN
INTRAVENOUS | Status: DC
  Administered 2019-09-14 – 2019-09-16 (×4): via INTRAVENOUS

## 2019-09-14 MED ORDER — BACITRACIN ZINC 500 UNIT/GM EX OINT
TOPICAL_OINTMENT | CUTANEOUS | Status: AC
  Filled 2019-09-14: qty 28.35

## 2019-09-14 MED ORDER — FENTANYL CITRATE (PF) 250 MCG/5ML IJ SOLN
INTRAMUSCULAR | Status: DC | PRN
  Administered 2019-09-14 (×2): 50 ug via INTRAVENOUS
  Administered 2019-09-14: 100 ug via INTRAVENOUS
  Administered 2019-09-14: 50 ug via INTRAVENOUS

## 2019-09-14 MED ORDER — SUGAMMADEX SODIUM 200 MG/2ML IV SOLN
INTRAVENOUS | Status: DC | PRN
  Administered 2019-09-14: 190.6 mg via INTRAVENOUS

## 2019-09-14 MED ORDER — MORPHINE SULFATE (PF) 2 MG/ML IV SOLN: 2.0000 mg | INTRAVENOUS | Status: DC | PRN

## 2019-09-14 MED ORDER — HEMOSTATIC AGENTS (NO CHARGE) OPTIME
TOPICAL | Status: DC | PRN
  Administered 2019-09-14: 1 via TOPICAL

## 2019-09-14 MED ORDER — SODIUM CHLORIDE 0.9 % IV SOLN
INTRAVENOUS | Status: AC
  Filled 2019-09-14: qty 500000

## 2019-09-14 MED ORDER — PROMETHAZINE HCL 25 MG RE SUPP
25.0000 mg | Freq: Four times a day (QID) | RECTAL | Status: DC | PRN
  Filled 2019-09-14: qty 1

## 2019-09-14 MED ORDER — BACITRACIN ZINC 500 UNIT/GM EX OINT
1.0000 "application " | TOPICAL_OINTMENT | Freq: Three times a day (TID) | CUTANEOUS | Status: DC
  Administered 2019-09-14 – 2019-09-16 (×8): 1 via TOPICAL
  Filled 2019-09-14 (×3): qty 28.35

## 2019-09-14 MED ORDER — MIDAZOLAM HCL 2 MG/2ML IJ SOLN
INTRAMUSCULAR | Status: AC
  Filled 2019-09-14: qty 2

## 2019-09-14 MED ORDER — LIDOCAINE 2% (20 MG/ML) 5 ML SYRINGE
INTRAMUSCULAR | Status: DC | PRN
  Administered 2019-09-14: 100 mg via INTRAVENOUS

## 2019-09-14 MED ORDER — LIDOCAINE-EPINEPHRINE 1 %-1:100000 IJ SOLN
INTRAMUSCULAR | Status: AC
  Filled 2019-09-14: qty 1

## 2019-09-14 MED ORDER — SODIUM CHLORIDE (PF) 0.9 % IJ SOLN
INTRAMUSCULAR | Status: AC
  Administered 2019-09-14: 04:00:00 3 mL
  Filled 2019-09-14: qty 50

## 2019-09-14 MED ORDER — CLINDAMYCIN PHOSPHATE 600 MG/50ML IV SOLN
600.0000 mg | Freq: Four times a day (QID) | INTRAVENOUS | Status: DC
  Administered 2019-09-14 – 2019-09-17 (×12): 600 mg via INTRAVENOUS
  Filled 2019-09-14 (×12): qty 50

## 2019-09-14 MED ORDER — ROCURONIUM BROMIDE 10 MG/ML (PF) SYRINGE
PREFILLED_SYRINGE | INTRAVENOUS | Status: DC | PRN
  Administered 2019-09-14: 20 mg via INTRAVENOUS
  Administered 2019-09-14: 40 mg via INTRAVENOUS

## 2019-09-14 SURGICAL SUPPLY — 41 items
BLADE RAD40 ROTATE 4M 4 5PK (BLADE) IMPLANT
BLADE RAD60 ROTATE M4 4 5PK (BLADE) IMPLANT
BLADE TRICUT ROTATE M4 4 5PK (BLADE) ×2 IMPLANT
BUR RND FLUTED 2.5 (BURR) ×1 IMPLANT
BUR ROUND FLUTED 5 RND (BURR) ×1 IMPLANT
CANISTER SUCT 3000ML PPV (MISCELLANEOUS) ×2 IMPLANT
CATH ROBINSON RED A/P 12FR (CATHETERS) ×1 IMPLANT
CATH ROBINSON RED A/P 14FR (CATHETERS) ×1 IMPLANT
COVER WAND RF STERILE (DRAPES) ×2 IMPLANT
DRAPE HALF SHEET 40X57 (DRAPES) IMPLANT
DRESSING NASAL KENNEDY 3.5X.9 (MISCELLANEOUS) IMPLANT
DRESSING TELFA 8X10 (GAUZE/BANDAGES/DRESSINGS) ×1 IMPLANT
DRSG NASAL KENNEDY 3.5X.9 (MISCELLANEOUS)
DRSG NASOPORE 8CM (GAUZE/BANDAGES/DRESSINGS) ×1 IMPLANT
ELECT REM PT RETURN 9FT ADLT (ELECTROSURGICAL) ×2
ELECTRODE REM PT RTRN 9FT ADLT (ELECTROSURGICAL) IMPLANT
FILTER ARTHROSCOPY CONVERTOR (FILTER) IMPLANT
GAUZE SPONGE 4X4 12PLY STRL (GAUZE/BANDAGES/DRESSINGS) ×1 IMPLANT
GLOVE ECLIPSE 7.5 STRL STRAW (GLOVE) ×2 IMPLANT
GOWN STRL REUS W/ TWL LRG LVL3 (GOWN DISPOSABLE) ×2 IMPLANT
GOWN STRL REUS W/TWL LRG LVL3 (GOWN DISPOSABLE) ×4
KIT BASIN OR (CUSTOM PROCEDURE TRAY) ×2 IMPLANT
KIT TURNOVER KIT B (KITS) ×2 IMPLANT
NDL PRECISIONGLIDE 27X1.5 (NEEDLE) ×1 IMPLANT
NEEDLE PRECISIONGLIDE 27X1.5 (NEEDLE) ×2 IMPLANT
NS IRRIG 1000ML POUR BTL (IV SOLUTION) ×2 IMPLANT
PAD ARMBOARD 7.5X6 YLW CONV (MISCELLANEOUS) ×4 IMPLANT
PATTIES SURGICAL .5 X3 (DISPOSABLE) ×3 IMPLANT
SHEATH ENDOSCRUB 0 DEG (SHEATH) IMPLANT
SHEATH ENDOSCRUB 30 DEG (SHEATH) IMPLANT
SPECIMEN JAR SMALL (MISCELLANEOUS) ×2 IMPLANT
SUT CHROMIC 4 0 P 3 18 (SUTURE) ×1 IMPLANT
SWAB COLLECTION DEVICE MRSA (MISCELLANEOUS) IMPLANT
SWAB CULTURE ESWAB REG 1ML (MISCELLANEOUS) IMPLANT
SYR 50ML SLIP (SYRINGE) IMPLANT
TAPE CLOTH .5X10 WHT NS (GAUZE/BANDAGES/DRESSINGS) ×1 IMPLANT
TOWEL GREEN STERILE FF (TOWEL DISPOSABLE) ×2 IMPLANT
TRAY ENT MC OR (CUSTOM PROCEDURE TRAY) ×2 IMPLANT
TUBE CONNECTING 12X1/4 (SUCTIONS) ×2 IMPLANT
TUBING EXTENTION W/L.L. (IV SETS) IMPLANT
WATER STERILE IRR 1000ML POUR (IV SOLUTION) ×2 IMPLANT

## 2019-09-14 NOTE — Plan of Care (Signed)
  Problem: Education: Goal: Knowledge of General Education information will improve Description Including pain rating scale, medication(s)/side effects and non-pharmacologic comfort measures Outcome: Progressing   

## 2019-09-14 NOTE — ED Notes (Signed)
[facility] leaving with patient ° °

## 2019-09-14 NOTE — ED Provider Notes (Signed)
6:40 AM  Patient transferred from [facility] to see ENT.  The patient has severe sinusitis with interval development of osseous dehiscence from the left frontal sinus through the left orbital roof into the superior left orbit without definite evidence for post septal or intraorbital cellulitis.  He has received IV antibiotics.  Pain is currently well controlled.  He is n.p.o.  Dr. Constance Holster with ENT has been made aware that patient is here in the emergency department.  COVID pending.  COVID negative.  Taken to OR.   I reviewed all nursing notes and pertinent previous records as available.  I have interpreted any EKGs, lab and urine results, imaging (as available).    Ward, Delice Bison, DO 09/14/19 [phone]

## 2019-09-14 NOTE — ED Notes (Signed)
  Dr. Constance Holster called and asked for patient to be brought up to short stay to prep for surgery.  Called short stay to give report and spoke with [facility].  Was told we needed to wait til COVID test came back before taking patient upstairs.
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
## 2019-09-14 NOTE — ED Provider Notes (Signed)
Case d/w Dr. Charleston Ropes in the ED, she is aware of transfer coming to hold for OR at [facility]  Patient has been NPO since before midnight.    Danai Gotto, MD 09/14/19 [id number]

## 2019-09-14 NOTE — Op Note (Signed)
OPERATIVE REPORT  DATE OF SURGERY: 09/14/2019  PATIENT:  Rodney Johnson,  38 y.o. male  PRE-OPERATIVE DIAGNOSIS:  Chronic and acute sinusitis with orbital infection  POST-OPERATIVE DIAGNOSIS:  Chronic and acute sinusitis with orbital infection  PROCEDURE:  Procedure(s): ENDOSCOPIC SINUS SURGERY AND LEFT FRONTAL SINUS TREPINATION  SURGEON:  Beckie Salts, MD  ASSISTANTS: None  ANESTHESIA:   General   EBL: 400 ml  DRAINS: 12 French red rubber catheter in frontal sinus, exiting through the forehead scalp, and other and intranasal.  LOCAL MEDICATIONS USED: 1% Xylocaine with epinephrine  SPECIMEN: 1.  Bilateral nasal and sinus contents, 2.  Left frontal sinus osteoma  COUNTS:  Correct  PROCEDURE DETAILS: The patient was taken to the operating room and placed on the operating table in the supine position. Following induction of general endotracheal anesthesia, the face was prepped and draped in a standard fashion.  Afrin spray was used preoperatively in the nasal cavities.  Using a 0 degree nasal endoscope the nasal cavities were inspected and local anesthetic was infiltrated into the middle turbinates, superior and posterior attachments as well as the lateral nasal wall.  1.  Bilateral endoscopic total ethmoidectomy.  The left middle meatus was filled with edematous and polypoid tissue.  The microdebrider was used to initiate the ethmoid dissection.  Complete ethmoidectomy was accomplished using the microdebrider and different forceps.  0 and 30 degree endoscopes were used.  The lamina papyracea was intact laterally.  The ground lamella was taken down to expose the posterior cells.  There is extensive polypoid disease present and inflamed and bloody tissue.  Similar procedure was performed on the right on the right side the uncinate process was identified initially and taken down with a sickle knife.  Dissection continued as described on the left side.  Lamina was intact.  Oscar La was  intact bilaterally.  At the end of the procedure half of a nasal pore packing was placed in each side.  2.  Left endoscopic maxillary antrostomy with removal of tissue.  With the slightest pressure on the fontanelle on the left side pus was obtained from the maxillary sinus.  The sinus was entered with a curved suction and the antrostomy was enlarged anteriorly using backbiting forceps and posteriorly using the microdebrider and Tru-Cut forceps.  Polypoid tissue was present throughout the antrum and was removed using the suction and angled forceps.  3.  Left endoscopic frontal sinusotomy.  The frontal recess was explored on the left side following the completion of the ethmoid dissection.  The bone was sclerotic.  After the trephination was completed I was able to pass a suction from above and down into the frontal duct.  4.  Left frontal sinus trephine.  A medial superior brow incision was outlined with a marking pen on the left side.  Local anesthetic was infiltrated.  Electrocautery was used to incise the skin subcutaneous tissue and all the way down to the periosteum which was stripped off of the face of the frontal bone.  A 5 mm round cutting bur was used to enter into the sinus through the cortex.  There is severe inflammatory tissue present throughout the entire sinus.  The trephine was enlarged to approximately 1-1/2 cm x 1 cm and the large osteoma was identified.  The osteoma itself measured approximately 2 cm in largest dimension.  I was able to break the osteoma up using a drill with a 2-1/2 mm bur.  I was able to remove it in multiple  fragments.  After that was removed the sinus was relatively clear and I was able to pass a suction down into the frontal duct and into the ethmoid cavity easily.  I placed a red rubber catheter as a drain with 1 and in the nasal cavity and the other end extending out from the scalp incision.  This was cut down to proper size.  Hemostatic agent was placed into the left  frontal sinus.  The incision was reapproximated in layers using interrupted 4-0 chromic suture.  A dressing was applied with bacitracin.     The ethmoid cavities were packed.  The nasal cavities and nasopharynx/oropharynx was suctioned of blood and secretions.  An orogastric tube was used to aspirate contents of the stomach.  Drip pad was applied under the nose.  The patient was awakened extubated and transferred to recovery in stable condition.    PATIENT DISPOSITION:  To PACU, stable

## 2019-09-14 NOTE — Transfer of Care (Signed)
Immediate Anesthesia Transfer of Care Note  Patient: Rodney Johnson  Procedure(s) Performed: ENDOSCOPIC SINUS SURGERY AND LEFT FRONTAL SINUS TREPINATION (Bilateral Face)  Patient Location: PACU  Anesthesia Type:General  Level of Consciousness: awake, alert  and patient cooperative  Airway & Oxygen Therapy: Patient Spontanous Breathing and Patient connected to face mask oxygen  Post-op Assessment: Report given to RN and Post -op Vital signs reviewed and stable  Post vital signs: Reviewed and stable  Last Vitals:  Vitals Value Taken Time  BP 131/76 09/14/19 1013  Temp    Pulse 74 09/14/19 1017  Resp 15 09/14/19 1017  SpO2 100 % 09/14/19 1017  Vitals shown include unvalidated device data.  Last Pain:  Vitals:   09/14/19 0636  TempSrc:   PainSc: 0-No pain         Complications: No apparent anesthesia complications
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
## 2019-09-14 NOTE — Progress Notes (Signed)
Patient ID: Rodney Johnson, male   DOB: 03-18-1981, 38 y.o.   MRN: [id number] Early postop check in PACU.  Sleepy but arousable.  Extraocular muscle activity intact.  Pupillary response normal.  Gross vision intact but he complains is blurry, similar to preop.  Dressing is in place.  No active bleeding.  Stable exam.

## 2019-09-14 NOTE — ED Notes (Signed)
Patient transported to CT 

## 2019-09-14 NOTE — ED Notes (Signed)
[facility] called for transport 

## 2019-09-14 NOTE — Anesthesia Postprocedure Evaluation (Signed)
Anesthesia Post Note  Patient: Rodney Johnson  Procedure(s) Performed: ENDOSCOPIC SINUS SURGERY AND LEFT FRONTAL SINUS TREPINATION (Bilateral Face)     Patient location during evaluation: PACU Anesthesia Type: General Level of consciousness: awake and alert Pain management: pain level controlled Vital Signs Assessment: post-procedure vital signs reviewed and stable Respiratory status: spontaneous breathing, nonlabored ventilation, respiratory function stable and patient connected to nasal cannula oxygen Cardiovascular status: blood pressure returned to baseline and stable Postop Assessment: no apparent nausea or vomiting Anesthetic complications: no    Last Vitals:  Vitals:   09/14/19 1218 09/14/19 1612  BP: 135/85 133/88  Pulse: 64 61  Resp: 14 12  Temp: 37.2 C 37.1 C  SpO2: 99% 99%    Last Pain:  Vitals:   09/14/19 1612  TempSrc: Oral  PainSc:                  Tiajuana Amass

## 2019-09-14 NOTE — Anesthesia Preprocedure Evaluation (Signed)
Anesthesia Evaluation  Patient identified by MRN, date of birth, ID band Patient awake  Preop documentation limited or incomplete due to emergent nature of procedure.  Airway Mallampati: II  TM Distance: >3 FB     Dental  (+) Dental Advisory Given   Pulmonary Current Smoker,    breath sounds clear to auscultation       Cardiovascular negative cardio ROS   Rhythm:Regular Rate:Normal     Neuro/Psych negative neurological ROS     GI/Hepatic negative GI ROS, Neg liver ROS,   Endo/Other  negative endocrine ROS  Renal/GU negative Renal ROS     Musculoskeletal   Abdominal   Peds  Hematology negative hematology ROS (+)   Anesthesia Other Findings   Reproductive/Obstetrics                             Anesthesia Physical Anesthesia Plan  ASA: III and emergent  Anesthesia Plan: General   Post-op Pain Management:    Induction: Intravenous  PONV Risk Score and Plan: 1 and Dexamethasone, Ondansetron and Treatment may vary due to age or medical condition  Airway Management Planned: Oral ETT  Additional Equipment:   Intra-op Plan:   Post-operative Plan: Possible Post-op intubation/ventilation and Extubation in OR  Informed Consent: I have reviewed the patients History and Physical, chart, labs and discussed the procedure including the risks, benefits and alternatives for the proposed anesthesia with the patient or authorized representative who has indicated his/her understanding and acceptance.     Dental advisory given  Plan Discussed with: CRNA  Anesthesia Plan Comments:         Anesthesia Quick Evaluation

## 2019-09-14 NOTE — Anesthesia Procedure Notes (Signed)
Procedure Name: Intubation Date/Time: 09/14/2019 7:46 AM Performed by: Renato Shin, CRNA Pre-anesthesia Checklist: Patient identified, Emergency Drugs available, Suction available and Patient being monitored Patient Re-evaluated:Patient Re-evaluated prior to induction Oxygen Delivery Method: Circle system utilized Preoxygenation: Pre-oxygenation with 100% oxygen Induction Type: IV induction and Rapid sequence Laryngoscope Size: Miller and 3 Grade View: Grade I Tube type: Oral Number of attempts: 1 Airway Equipment and Method: Stylet and Oral airway Placement Confirmation: ETT inserted through vocal cords under direct vision,  positive ETCO2 and breath sounds checked- equal and bilateral Secured at: 21 cm Tube secured with: Tape Dental Injury: Teeth and Oropharynx as per pre-operative assessment

## 2019-09-15 ENCOUNTER — Encounter (HOSPITAL_COMMUNITY): Payer: Self-pay | Admitting: Otolaryngology

## 2019-09-15 LAB — CBC
HCT: 39.8 % (ref 39.0–52.0)
Hemoglobin: 13.4 g/dL (ref 13.0–17.0)
MCH: 29.6 pg (ref 26.0–34.0)
MCHC: 33.7 g/dL (ref 30.0–36.0)
MCV: 88.1 fL (ref 80.0–100.0)
Platelets: 332 10*3/uL (ref 150–400)
RBC: 4.52 MIL/uL (ref 4.22–5.81)
RDW: 12.3 % (ref 11.5–15.5)
WBC: 20.7 10*3/uL — ABNORMAL HIGH (ref 4.0–10.5)
nRBC: 0 % (ref 0.0–0.2)

## 2019-09-15 NOTE — Plan of Care (Signed)
  Problem: Education: Goal: Knowledge of General Education information will improve Description Including pain rating scale, medication(s)/side effects and non-pharmacologic comfort measures Outcome: Progressing   

## 2019-09-15 NOTE — Progress Notes (Signed)
Patient ID: Rodney Johnson, male   DOB: 01/18/1981, 38 y.o.   MRN: [id number] Subjective: Complains of a little bit of headache at the surgical site but otherwise feeling much better.  He does not complain about the black spots in his visual field any longer.  Objective: Vital signs in last 24 hours: Temp:  [97 F (36.1 C)-99 F (37.2 C)] 97.7 F (36.5 C) (10/25 0838) Pulse Rate:  [61-76] 74 (10/25 0838) Resp:  [11-19] 17 (10/25 0838) BP: (121-137)/(67-88) 136/81 (10/25 0838) SpO2:  [97 %-100 %] 99 % (10/25 0838) Weight:  [96.4 kg] 96.4 kg (10/24 1316) Weight change: 1.145 kg Last BM Date: 09/14/19  Intake/Output from previous day: 10/24 0701 - 10/25 0700 In: 2764.8 [P.O.:600; I.V.:2014.8; IV Piggyback:150] Out: 400 [Blood:400] Intake/Output this shift: No intake/output data recorded.  PHYSICAL EXAM: He is awake and alert.  He is eating breakfast.  EOMs are intact.  Vision is grossly intact.  Nasal and forehead dressing are mostly clean and dry.  Lab Results: Recent Labs    09/14/19 0202 09/14/19 0230 09/15/19 0314  WBC 11.8*  --  20.7*  HGB 14.8 14.3 13.4  HCT 45.6 42.0 39.8  PLT 335  --  332   BMET Recent Labs    09/14/19 0230  NA 139  K 4.1  CL 103  GLUCOSE 90  BUN 13  CREATININE 0.80    Studies/Results: Ct Head Wo Contrast  Result Date: 09/14/2019 CLINICAL DATA:  38 year old male with left-sided facial pain. EXAM: CT HEAD WITHOUT CONTRAST TECHNIQUE: Contiguous axial images were obtained from the base of the skull through the vertex without intravenous contrast. COMPARISON:  Head CT dated 03/09/2018 FINDINGS: Brain: The ventricles and sulci appropriate size for patient's age. The gray-white matter discrimination is preserved. There is no acute intracranial hemorrhage. No mass effect or midline shift. No extra-axial fluid collection. Vascular: No hyperdense vessel or unexpected calcification. Skull: Normal. Negative for fracture or focal lesion. Sinuses/Orbits:  Opacification of the frontal, and left maxillary sinus as well as multiple ethmoid air cells with calcification within the left frontal sinus in keeping with chronic paranasal sinus disease. There is mucoperiosteal thickening of the right maxillary sinus. The mastoid air cells are clear. Other: Left periorbital hematoma. IMPRESSION: 1. No acute intracranial pathology. 2. Left periorbital hematoma. 3. Chronic he paranasal sinus disease. Electronically Signed   By: Anner Crete M.D.   On: 09/14/2019 02:15   Ct Maxillofacial W Contrast  Result Date: 09/14/2019 CLINICAL DATA:  Initial evaluation for acute left facial pain, going on for months. EXAM: CT MAXILLOFACIAL WITH CONTRAST TECHNIQUE: Multidetector CT imaging of the maxillofacial structures was performed with intravenous contrast. Multiplanar CT image reconstructions were also generated. CONTRAST:  162mL OMNIPAQUE IOHEXOL 300 MG/ML  SOLN COMPARISON:  Prior CT from 12/25/2018. FINDINGS: Osseous: Scattered periapical lucencies noted about several bilateral maxillary and mandibular molars. No oral antral fistula. No acute facial fracture. Mandible intact with the mandibular condyles normally situated. Orbits: Chronic fracture of the left lamina papyracea with herniation of a portion of the intraorbital fat, stable. There has been interval development of osseous dehiscence between the left frontal sinus and superior left orbit, through the orbital roof (series 9, image 25). Soft tissue density abuts the adjacent superior rectus muscle. No definite active inflammation to suggest postseptal or intraorbital cellulitis. Globes symmetric in size and within normal limits. Sinuses: Soft tissue opacity completely fills and opacifies the frontal sinuses bilaterally as well as the frontoethmoidal recesses. Unchanged left frontoethmoidal  osteoma. Complete opacification of the left maxillary sinus and ostiomeatal unit with associated chronic osteitis. Moderate mucosal  thickening about the right maxillary sinus. No air-fluid levels. Mastoid air cells and middle ear cavities are well pneumatized and free of fluid. Opacified bilateral Haller air cells noted. Soft tissues: Asymmetric soft tissue swelling seen involving the left preseptal soft tissues. No loculated collection. No other acute soft tissue abnormality about the face. Limited intracranial: Unremarkable. IMPRESSION: 1. Severe chronic paranasal sinusitis involving the frontal sinuses, anterior ethmoidal air cells, and left maxillary sinus, with obstruction of the left ostiomeatal unit. 2. Interval development of osseous dehiscence from the left frontal sinus through the left orbital roof into the superior left orbit. No definite evidence for postseptal or intraorbital cellulitis. 3. Apparent asymmetric soft tissue swelling involving the left preseptal soft tissues. Clinical correlation for possible acute left preseptal cellulitis recommended. Again, no definite postseptal or intraorbital extension. Electronically Signed   By: Jeannine Boga M.D.   On: 09/14/2019 03:25    Medications: I have reviewed the patient's current medications.  Assessment/Plan: Postop day 1, clinically improving.  Continue to monitor for orbital or visual symptoms.  Check blood count again tomorrow.  If doing well I will trim off the forehead external drain and pull it from the nose so that it no longer exits the forehead scalp.  Continue IV antibiotics for now.  LOS: 1 day   [name] 09/15/2019, 9:13 AM

## 2019-09-16 LAB — CBC WITH DIFFERENTIAL/PLATELET
Abs Immature Granulocytes: 0.15 10*3/uL — ABNORMAL HIGH (ref 0.00–0.07)
Basophils Absolute: 0 10*3/uL (ref 0.0–0.1)
Basophils Relative: 0 %
Eosinophils Absolute: 0 10*3/uL (ref 0.0–0.5)
Eosinophils Relative: 0 %
HCT: 37.4 % — ABNORMAL LOW (ref 39.0–52.0)
Hemoglobin: 12.6 g/dL — ABNORMAL LOW (ref 13.0–17.0)
Immature Granulocytes: 1 %
Lymphocytes Relative: 6 %
Lymphs Abs: 1.4 10*3/uL (ref 0.7–4.0)
MCH: 29.6 pg (ref 26.0–34.0)
MCHC: 33.7 g/dL (ref 30.0–36.0)
MCV: 88 fL (ref 80.0–100.0)
Monocytes Absolute: 1 10*3/uL (ref 0.1–1.0)
Monocytes Relative: 4 %
Neutro Abs: 21.1 10*3/uL — ABNORMAL HIGH (ref 1.7–7.7)
Neutrophils Relative %: 89 %
Platelets: 312 10*3/uL (ref 150–400)
RBC: 4.25 MIL/uL (ref 4.22–5.81)
RDW: 12.4 % (ref 11.5–15.5)
WBC: 23.6 10*3/uL — ABNORMAL HIGH (ref 4.0–10.5)
nRBC: 0 % (ref 0.0–0.2)

## 2019-09-16 MED ORDER — SALINE SPRAY 0.65 % NA SOLN
1.0000 | NASAL | Status: DC | PRN
  Administered 2019-09-16: 1 via NASAL
  Filled 2019-09-16: qty 44

## 2019-09-16 NOTE — Progress Notes (Signed)
Patient ID: SHAMAN MALEY, male   DOB: Jun 08, 1981, 38 y.o.   MRN: [id number] Subjective: No complaints, he is feeling better every day.  He feels that his vision is completely back to normal.  Objective: Vital signs in last 24 hours: Temp:  [97.6 F (36.4 C)-98.4 F (36.9 C)] 98.4 F (36.9 C) (10/26 0536) Pulse Rate:  [56-63] 59 (10/26 0536) Resp:  [16-18] 18 (10/26 0536) BP: (119-144)/(68-89) 123/68 (10/26 0536) SpO2:  [98 %-100 %] 99 % (10/26 0536) Weight change:  Last BM Date: 09/14/19  Intake/Output from previous day: 10/25 0701 - 10/26 0700 In: 800 [P.O.:800] Out: -  Intake/Output this shift: No intake/output data recorded.  PHYSICAL EXAM: Left eye swelling almost completely resolved.  Extraocular muscle activity intact.  Vision grossly normal.  The drain was cut off at the skin at the forehead site and then pulled through the nose for about 2-3 cm.  It was then cut off at the nasal opening.  Lab Results: Recent Labs    09/15/19 0314 09/16/19 0343  WBC 20.7* 23.6*  HGB 13.4 12.6*  HCT 39.8 37.4*  PLT 332 312   BMET Recent Labs    09/14/19 0230  NA 139  K 4.1  CL 103  GLUCOSE 90  BUN 13  CREATININE 0.80    Studies/Results: No results found.  Medications: I have reviewed the patient's current medications.  Assessment/Plan: Continues to improve clinically although the white cell count continues to rise.  We will recheck again tomorrow.  Perhaps having advance the drain will assist.  Once his white blood cell count is trending downwards and he continues to do well clinically he can go home on oral antibiotics.  LOS: 2 days   [name] 09/16/2019, 11:33 AM

## 2019-09-17 ENCOUNTER — Encounter (HOSPITAL_COMMUNITY): Payer: Self-pay

## 2019-09-17 ENCOUNTER — Encounter (HOSPITAL_COMMUNITY): Payer: Self-pay | Admitting: *Deleted

## 2019-09-17 LAB — CBC
HCT: 35.8 % — ABNORMAL LOW (ref 39.0–52.0)
Hemoglobin: 12 g/dL — ABNORMAL LOW (ref 13.0–17.0)
MCH: 29.8 pg (ref 26.0–34.0)
MCHC: 33.5 g/dL (ref 30.0–36.0)
MCV: 88.8 fL (ref 80.0–100.0)
Platelets: 311 10*3/uL (ref 150–400)
RBC: 4.03 MIL/uL — ABNORMAL LOW (ref 4.22–5.81)
RDW: 12.4 % (ref 11.5–15.5)
WBC: 20.2 10*3/uL — ABNORMAL HIGH (ref 4.0–10.5)
nRBC: 0 % (ref 0.0–0.2)

## 2019-09-17 MED ORDER — CLINDAMYCIN HCL 300 MG PO CAPS: 300.0000 mg | ORAL_CAPSULE | Freq: Three times a day (TID) | ORAL | 0 refills | Status: DC

## 2019-09-17 MED ORDER — PROMETHAZINE HCL 25 MG RE SUPP: 25.0000 mg | Freq: Four times a day (QID) | RECTAL | 1 refills | Status: DC | PRN

## 2019-09-17 MED ORDER — HYDROCODONE-ACETAMINOPHEN 7.5-325 MG PO TABS: 1.0000 | ORAL_TABLET | Freq: Four times a day (QID) | ORAL | 0 refills | Status: DC | PRN

## 2019-09-17 NOTE — Discharge Instructions (Signed)
Avoid any heavy lifting or bending over for the next 2 weeks.   Use nasal saline spray every 30-60 minutes while awake.   Keep a dressing on the forehead if necessary.  If there is no more drainage than it is okay to leave it open.  You can put some antibiotic ointment on that twice daily.   Keep the nasal dressing in place as long as there is any drainage.   Avoid any heavy nose blowing.   Open mouth when sneezing.

## 2019-09-17 NOTE — Progress Notes (Signed)
Patient discharged to home. Verbalizes understanding of all discharge instructions including incision care, discharge medications, and follow up MD visits. Patient accompanied by spouse.  

## 2019-09-17 NOTE — Discharge Summary (Signed)
Physician Discharge Summary  Patient ID: Rodney Johnson MRN: [id number] DOB/AGE: 1981-10-29 38 y.o.  Admit date: 09/14/2019 Discharge date: 09/17/2019  Admission Diagnoses: Acute and chronic sinusitis with preseptal orbital cellulitis  Discharge Diagnoses:  Active Problems:   Orbital cellulitis on left   Discharged Condition: good  Hospital Course: Steady improvement postoperatively, no complications.  Consults: none  Significant Diagnostic Studies: none  Treatments: surgery: Endoscopic sinus surgery and left frontal sinus trephine  Discharge Exam: Blood pressure 129/80, pulse (!) 51, temperature 97.8 F (36.6 C), temperature source Oral, resp. rate 18, height 5\' 9"  (1.753 m), weight 96.4 kg, SpO2 100 %. PHYSICAL EXAM: Awake and alert, no facial swelling.  Left eye looks normal.  Vision is intact.  EOMs are intact.  No nasal bleeding.  Disposition: Discharge disposition: 01-Home or Self Care       Discharge Instructions    Diet - low sodium heart healthy   Complete by: As directed    Increase activity slowly   Complete by: As directed      Allergies as of 09/17/2019   No Known Allergies     Medication List    TAKE these medications   acetaminophen 325 MG tablet Commonly known as: TYLENOL Take 650 mg by mouth every 6 (six) hours as needed (pain).   cetirizine-pseudoephedrine 5-120 MG tablet Commonly known as: ZYRTEC-D Take 1 tablet by mouth daily.   clindamycin 300 MG capsule Commonly known as: Cleocin Take 1 capsule (300 mg total) by mouth 3 (three) times daily.   HYDROcodone-acetaminophen 7.5-325 MG tablet Commonly known as: Norco Take 1 tablet by mouth every 6 (six) hours as needed for moderate pain.   promethazine 25 MG suppository Commonly known as: PHENERGAN Place 1 suppository (25 mg total) rectally every 6 (six) hours as needed for nausea or vomiting.      Follow-up Information    [name], MD. Schedule an appointment as soon as  possible for a visit in 1 week(s).   Specialty: Otolaryngology Contact information: [address] [place]  [address] [phone]           Signed: [name] 09/17/2019, 8:16 AM

## 2019-09-19 LAB — CULTURE, BLOOD (ROUTINE X 2)
Culture: NO GROWTH
Culture: NO GROWTH

## 2019-09-19 LAB — SURGICAL PATHOLOGY

## 2019-12-10 DIAGNOSIS — K219 Gastro-esophageal reflux disease without esophagitis: Secondary | ICD-10-CM | POA: Insufficient documentation

## 2020-01-14 ENCOUNTER — Other Ambulatory Visit: Payer: Self-pay

## 2020-01-14 ENCOUNTER — Encounter (HOSPITAL_COMMUNITY): Payer: Self-pay | Admitting: Otolaryngology

## 2020-01-14 ENCOUNTER — Other Ambulatory Visit (HOSPITAL_COMMUNITY)
Admission: RE | Admit: 2020-01-14 | Discharge: 2020-01-14 | Disposition: A | Discharge status: Home / Self Care | Payer: 59 | Source: Ambulatory Visit | Attending: Otolaryngology | Admitting: Otolaryngology

## 2020-01-14 DIAGNOSIS — Z20822 Contact with and (suspected) exposure to covid-19: Secondary | ICD-10-CM | POA: Diagnosis not present

## 2020-01-14 DIAGNOSIS — Z01812 Encounter for preprocedural laboratory examination: Secondary | ICD-10-CM | POA: Insufficient documentation

## 2020-01-14 LAB — SARS CORONAVIRUS 2 (TAT 6-24 HRS): SARS Coronavirus 2: NEGATIVE
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
## 2020-01-14 NOTE — Progress Notes (Signed)
Pt denies SOB, chest pain, and being under the care of a cardiologist and PCP. Pt denies having a stress test, echo and cardiac cath. Pt denies having an EKG and chest x ray in the last year. Pt denies recent labs. Pt made aware to stop taking Aspirin (unless otherwise advised by surgeon), vitamins, fish oil and herbal medications. Do not take any NSAIDs ie: Ibuprofen, Advil, Naproxen (Aleve), Motrin, BC and Goody Powder. Pt reminded to quarantine. Pt verbalized understanding of all pre-op instructions.

## 2020-01-15 ENCOUNTER — Ambulatory Visit (HOSPITAL_COMMUNITY)
Admission: RE | Admit: 2020-01-15 | Discharge: 2020-01-15 | Disposition: A | Discharge status: Home / Self Care | Payer: 59 | Attending: Otolaryngology | Admitting: Otolaryngology

## 2020-01-15 ENCOUNTER — Ambulatory Visit (HOSPITAL_COMMUNITY): Payer: 59

## 2020-01-15 ENCOUNTER — Encounter (HOSPITAL_COMMUNITY): Payer: Self-pay | Admitting: Otolaryngology

## 2020-01-15 ENCOUNTER — Other Ambulatory Visit: Payer: Self-pay

## 2020-01-15 ENCOUNTER — Encounter (HOSPITAL_COMMUNITY)
Admission: RE | Disposition: A | Discharge status: Home / Self Care | Payer: Self-pay | Source: Home / Self Care | Attending: Otolaryngology

## 2020-01-15 DIAGNOSIS — Z79899 Other long term (current) drug therapy: Secondary | ICD-10-CM | POA: Diagnosis not present

## 2020-01-15 DIAGNOSIS — F17200 Nicotine dependence, unspecified, uncomplicated: Secondary | ICD-10-CM | POA: Insufficient documentation

## 2020-01-15 DIAGNOSIS — K219 Gastro-esophageal reflux disease without esophagitis: Secondary | ICD-10-CM | POA: Diagnosis not present

## 2020-01-15 DIAGNOSIS — J324 Chronic pansinusitis: Secondary | ICD-10-CM | POA: Diagnosis present

## 2020-01-15 HISTORY — DX: Gastro-esophageal reflux disease without esophagitis: K21.9

## 2020-01-15 HISTORY — DX: Presence of spectacles and contact lenses: Z97.3

## 2020-01-15 HISTORY — PX: NASAL SINUS SURGERY: SHX719

## 2020-01-15 HISTORY — DX: Chronic sinusitis, unspecified: J32.9

## 2020-01-15 LAB — HEMOGLOBIN AND HEMATOCRIT, BLOOD
HCT: 48.6 % (ref 39.0–52.0)
Hemoglobin: 15.8 g/dL (ref 13.0–17.0)

## 2020-01-15 SURGERY — SINUS SURGERY, ENDOSCOPIC
Anesthesia: General | Site: Nose | Laterality: Bilateral

## 2020-01-15 MED ORDER — OXYMETAZOLINE HCL 0.05 % NA SOLN
NASAL | Status: DC | PRN
  Administered 2020-01-15: 1

## 2020-01-15 MED ORDER — ESMOLOL HCL 100 MG/10ML IV SOLN
INTRAVENOUS | Status: DC | PRN
  Administered 2020-01-15: 20 mg via INTRAVENOUS
  Administered 2020-01-15: 10 mg via INTRAVENOUS
  Administered 2020-01-15 (×2): 20 mg via INTRAVENOUS

## 2020-01-15 MED ORDER — HYDROCODONE-ACETAMINOPHEN 7.5-325 MG PO TABS: 1.0000 | ORAL_TABLET | Freq: Four times a day (QID) | ORAL | 0 refills | Status: DC | PRN

## 2020-01-15 MED ORDER — HYDROMORPHONE HCL 1 MG/ML IJ SOLN: 0.2500 mg | INTRAMUSCULAR | Status: DC | PRN

## 2020-01-15 MED ORDER — SODIUM CHLORIDE 0.9 % IR SOLN
Status: DC | PRN
  Administered 2020-01-15: 1000 mL

## 2020-01-15 MED ORDER — ONDANSETRON HCL 4 MG/2ML IJ SOLN
INTRAMUSCULAR | Status: DC | PRN
  Administered 2020-01-15: 4 mg via INTRAVENOUS

## 2020-01-15 MED ORDER — OXYMETAZOLINE HCL 0.05 % NA SOLN
NASAL | Status: AC
  Filled 2020-01-15: qty 30

## 2020-01-15 MED ORDER — PROMETHAZINE HCL 25 MG RE SUPP: 25.0000 mg | Freq: Four times a day (QID) | RECTAL | 1 refills | Status: DC | PRN

## 2020-01-15 MED ORDER — PROPOFOL 10 MG/ML IV BOLUS
INTRAVENOUS | Status: DC | PRN
  Administered 2020-01-15: 150 mg via INTRAVENOUS

## 2020-01-15 MED ORDER — DEXAMETHASONE SODIUM PHOSPHATE 10 MG/ML IJ SOLN
INTRAMUSCULAR | Status: AC
  Filled 2020-01-15: qty 1

## 2020-01-15 MED ORDER — OXYMETAZOLINE HCL 0.05 % NA SOLN
2.0000 | NASAL | Status: DC
  Administered 2020-01-15: 2 via NASAL
  Filled 2020-01-15: qty 30

## 2020-01-15 MED ORDER — MIDAZOLAM HCL 5 MG/5ML IJ SOLN
INTRAMUSCULAR | Status: DC | PRN
  Administered 2020-01-15: 2 mg via INTRAVENOUS

## 2020-01-15 MED ORDER — LACTATED RINGERS IV SOLN: INTRAVENOUS | Status: DC

## 2020-01-15 MED ORDER — SUGAMMADEX SODIUM 200 MG/2ML IV SOLN
INTRAVENOUS | Status: DC | PRN
  Administered 2020-01-15: 200 mg via INTRAVENOUS

## 2020-01-15 MED ORDER — LIDOCAINE HCL 4 % MT SOLN
OROMUCOSAL | Status: DC | PRN
  Administered 2020-01-15: 3 mL via TOPICAL

## 2020-01-15 MED ORDER — ACETAMINOPHEN 500 MG PO TABS
1000.0000 mg | ORAL_TABLET | Freq: Once | ORAL | Status: AC
  Administered 2020-01-15: 1000 mg via ORAL
  Filled 2020-01-15: qty 2

## 2020-01-15 MED ORDER — LIDOCAINE-EPINEPHRINE 1 %-1:100000 IJ SOLN
INTRAMUSCULAR | Status: DC | PRN
  Administered 2020-01-15: 9 mL

## 2020-01-15 MED ORDER — OXYCODONE HCL 5 MG PO TABS
ORAL_TABLET | ORAL | Status: AC
  Filled 2020-01-15: qty 1

## 2020-01-15 MED ORDER — FENTANYL CITRATE (PF) 100 MCG/2ML IJ SOLN
INTRAMUSCULAR | Status: DC | PRN
  Administered 2020-01-15: 150 ug via INTRAVENOUS
  Administered 2020-01-15 (×2): 50 ug via INTRAVENOUS

## 2020-01-15 MED ORDER — PROPOFOL 10 MG/ML IV BOLUS
INTRAVENOUS | Status: AC
  Filled 2020-01-15: qty 20

## 2020-01-15 MED ORDER — DEXAMETHASONE SODIUM PHOSPHATE 10 MG/ML IJ SOLN
INTRAMUSCULAR | Status: DC | PRN
  Administered 2020-01-15: 10 mg via INTRAVENOUS

## 2020-01-15 MED ORDER — FENTANYL CITRATE (PF) 250 MCG/5ML IJ SOLN
INTRAMUSCULAR | Status: AC
  Filled 2020-01-15: qty 5

## 2020-01-15 MED ORDER — ROCURONIUM BROMIDE 50 MG/5ML IV SOSY
PREFILLED_SYRINGE | INTRAVENOUS | Status: DC | PRN
  Administered 2020-01-15: 30 mg via INTRAVENOUS
  Administered 2020-01-15: 50 mg via INTRAVENOUS

## 2020-01-15 MED ORDER — ONDANSETRON HCL 4 MG/2ML IJ SOLN
INTRAMUSCULAR | Status: AC
  Filled 2020-01-15: qty 2

## 2020-01-15 MED ORDER — LIDOCAINE 2% (20 MG/ML) 5 ML SYRINGE
INTRAMUSCULAR | Status: DC | PRN
  Administered 2020-01-15: 60 mg via INTRAVENOUS

## 2020-01-15 MED ORDER — LIDOCAINE-EPINEPHRINE 1 %-1:100000 IJ SOLN
INTRAMUSCULAR | Status: AC
  Filled 2020-01-15: qty 1

## 2020-01-15 MED ORDER — ARTIFICIAL TEARS OPHTHALMIC OINT
TOPICAL_OINTMENT | OPHTHALMIC | Status: DC | PRN
  Administered 2020-01-15: 1 via OPHTHALMIC

## 2020-01-15 MED ORDER — 0.9 % SODIUM CHLORIDE (POUR BTL) OPTIME
TOPICAL | Status: DC | PRN
  Administered 2020-01-15: 1000 mL

## 2020-01-15 MED ORDER — BACITRACIN ZINC 500 UNIT/GM EX OINT
TOPICAL_OINTMENT | CUTANEOUS | Status: AC
  Filled 2020-01-15: qty 28.35

## 2020-01-15 MED ORDER — MIDAZOLAM HCL 2 MG/2ML IJ SOLN
INTRAMUSCULAR | Status: AC
  Filled 2020-01-15: qty 2

## 2020-01-15 MED ORDER — ESMOLOL HCL 100 MG/10ML IV SOLN
INTRAVENOUS | Status: AC
  Filled 2020-01-15: qty 10

## 2020-01-15 SURGICAL SUPPLY — 35 items
BLADE RAD40 ROTATE 4M 4 5PK (BLADE) IMPLANT
BLADE RAD60 ROTATE M4 4 5PK (BLADE) IMPLANT
BLADE TRICUT ROTATE M4 4 5PK (BLADE) ×2 IMPLANT
CANISTER SUCT 3000ML PPV (MISCELLANEOUS) ×2 IMPLANT
CATH ROBINSON RED A/P 8FR (CATHETERS) ×1 IMPLANT
COVER WAND RF STERILE (DRAPES) ×1 IMPLANT
DRAPE HALF SHEET 40X57 (DRAPES) IMPLANT
DRESSING NASAL KENNEDY 3.5X.9 (MISCELLANEOUS) IMPLANT
DRSG NASAL KENNEDY 3.5X.9 (MISCELLANEOUS)
DRSG NASOPORE 8CM (GAUZE/BANDAGES/DRESSINGS) ×2 IMPLANT
ELECT REM PT RETURN 9FT ADLT (ELECTROSURGICAL)
ELECTRODE REM PT RTRN 9FT ADLT (ELECTROSURGICAL) IMPLANT
FILTER ARTHROSCOPY CONVERTOR (FILTER) ×1 IMPLANT
GAUZE 4X4 16PLY RFD (DISPOSABLE) ×1 IMPLANT
GLOVE ECLIPSE 7.5 STRL STRAW (GLOVE) ×2 IMPLANT
GOWN STRL REUS W/ TWL LRG LVL3 (GOWN DISPOSABLE) ×2 IMPLANT
GOWN STRL REUS W/TWL LRG LVL3 (GOWN DISPOSABLE) ×4
KIT BASIN OR (CUSTOM PROCEDURE TRAY) ×2 IMPLANT
KIT TURNOVER KIT B (KITS) ×2 IMPLANT
NDL PRECISIONGLIDE 27X1.5 (NEEDLE) ×1 IMPLANT
NEEDLE PRECISIONGLIDE 27X1.5 (NEEDLE) ×2 IMPLANT
NS IRRIG 1000ML POUR BTL (IV SOLUTION) ×2 IMPLANT
PAD ARMBOARD 7.5X6 YLW CONV (MISCELLANEOUS) ×4 IMPLANT
PATTIES SURGICAL .5 X3 (DISPOSABLE) ×2 IMPLANT
SHEATH ENDOSCRUB 0 DEG (SHEATH) IMPLANT
SHEATH ENDOSCRUB 30 DEG (SHEATH) IMPLANT
SPECIMEN JAR SMALL (MISCELLANEOUS) ×2 IMPLANT
SWAB COLLECTION DEVICE MRSA (MISCELLANEOUS) IMPLANT
SWAB CULTURE ESWAB REG 1ML (MISCELLANEOUS) IMPLANT
SYR 50ML SLIP (SYRINGE) IMPLANT
TOWEL GREEN STERILE FF (TOWEL DISPOSABLE) ×2 IMPLANT
TRAY ENT MC OR (CUSTOM PROCEDURE TRAY) ×2 IMPLANT
TUBE CONNECTING 12X1/4 (SUCTIONS) ×2 IMPLANT
TUBING EXTENTION W/L.L. (IV SETS) IMPLANT
WATER STERILE IRR 1000ML POUR (IV SOLUTION) ×2 IMPLANT

## 2020-01-15 NOTE — Interval H&P Note (Signed)
History and Physical Interval Note:  01/15/2020 9:59 AM  Rodney Johnson  has presented today for surgery, with the diagnosis of Acute frontal sinusitis.  The various methods of treatment have been discussed with the patient and family. After consideration of risks, benefits and other options for treatment, the patient has consented to  Procedure(s): ENDOSCOPIC SINUS SURGERY (Bilateral) as a surgical intervention.  The patient's history has been reviewed, patient examined, no change in status, stable for surgery.  I have reviewed the patient's chart and labs.  Questions were answered to the patient's satisfaction.     [name]

## 2020-01-15 NOTE — Anesthesia Procedure Notes (Addendum)
Procedure Name: Intubation Date/Time: 01/15/2020 10:20 AM Performed by: Larene Beach, CRNA Pre-anesthesia Checklist: Patient identified, Emergency Drugs available, Suction available and Patient being monitored Patient Re-evaluated:Patient Re-evaluated prior to induction Oxygen Delivery Method: Circle system utilized Preoxygenation: Pre-oxygenation with 100% oxygen Induction Type: IV induction Ventilation: Mask ventilation without difficulty Laryngoscope Size: Mac and 4 Grade View: Grade I Tube type: Oral Tube size: 7.5 mm Number of attempts: 1 Airway Equipment and Method: Stylet and Oral airway Placement Confirmation: ETT inserted through vocal cords under direct vision,  positive ETCO2 and breath sounds checked- equal and bilateral Secured at: 23 cm Tube secured with: Tape Dental Injury: Teeth and Oropharynx as per pre-operative assessment  Comments: [place] performed intubation

## 2020-01-15 NOTE — Op Note (Signed)
OPERATIVE REPORT  DATE OF SURGERY: 01/15/2020  PATIENT:  Rodney Johnson,  39 y.o. male  PRE-OPERATIVE DIAGNOSIS:  Chronic pansinusitis  POST-OPERATIVE DIAGNOSIS:  Chronic pansinusitis  PROCEDURE:  Procedure(s): ENDOSCOPIC SINUS SURGERY  SURGEON:  Beckie Salts, MD  ASSISTANTS: none  ANESTHESIA:   General   EBL:  500 ml  DRAINS: none  LOCAL MEDICATIONS USED: 1% Xylocaine with epinephrine  SPECIMEN: Bilateral nasal and sinus contents  COUNTS:  Correct  PROCEDURE DETAILS: The patient was taken to the operating room and placed on the operating table in the supine position. Following induction of general endotracheal anesthesia, the face was draped in the standard fashion.  Oxymetazoline spray was used preoperatively in the nasal cavity.  1% Xylocaine with epinephrine was infiltrated into the posterior and superior attachment of the right middle turbinate and lateral nasal wall bilaterally.  The middle turbinate was mostly gone on the left.  The upper septum and lateral nasal wall were infiltrated on the left.  Afrin-soaked pledgets were used throughout the case periodically.  1.  Bilateral endoscopic maxillary antrostomy.  Using the 30 degree endoscope the maxillary ostium was entered with a curved suction on both sides.  On the left there was swollen tissue completely occluding the opening.  The sinus itself was filled with swollen inflamed mucosa and purulent exudate.  On the right the opening was completely obstructed with soft tissue as well but the sinus itself was clear and aerated.  The antrostomy was then enlarged bilaterally anteriorly using the backbiting forceps and posteriorly and inferiorly using the microdebrider and the Tru-Cut forceps.  At the end of the case the left maxillary sinus was packed with bacitracin coated half of a nasal pore dressing.  2.  Bilateral endoscopic revision total ethmoidectomy.  Using combination of 0 and 30 degree nasal endoscopes and the  microdebrider a complete ethmoidectomy was performed on both sides.  Bony septations were thickened and osteitic.  All partitions were taken down effectively opening up the entire ethmoid cavity on both sides.  The lamina papyracea was kept intact bilaterally.  The fovea was intact bilaterally as well.  At the end of the case the ethmoid cavities were packed with half of a nasal pore packing.  3.  Bilateral endoscopic frontal sinusotomy.  After the ethmoid dissection was completed 30 and 70 degree nasal endoscopes were used to inspect the frontal recess area.  On the right side there was some polypoid tissue obstructing which was removed with giraffe forceps.  The sinus itself was clear otherwise.  On the left side there was severe osteitic bone around the frontal recess.  This was carefully taken down using up-biting forceps and giraffe forceps.  A sinus seeker was used as well to help identify the natural outflow path.  Additionally a syringe with saline and an 18-gauge needle was passed externally through the prior trephination incision and irrigated the left frontal sinus to identify the natural drainage pathway.  There was thick purulent secretions that were flushed out.  Ultimately the sinus ostium was opened adequately and a thin piece of nasal pore was packed into the frontal duct.  Oropharynx was suctioned of blood and secretions.  Patient was awakened extubated and transferred to recovery in stable condition.    PATIENT DISPOSITION:  To PACU, stable

## 2020-01-15 NOTE — Discharge Instructions (Signed)
Start nasal saline spray hourly while awake tomorrow.

## 2020-01-15 NOTE — Anesthesia Postprocedure Evaluation (Signed)
Anesthesia Post Note  Patient: Juanangel Data processing manager  Procedure(s) Performed: ENDOSCOPIC SINUS SURGERY (Bilateral Nose)     Patient location during evaluation: PACU Anesthesia Type: General Level of consciousness: awake and alert Pain management: pain level controlled Vital Signs Assessment: post-procedure vital signs reviewed and stable Respiratory status: spontaneous breathing, nonlabored ventilation and respiratory function stable Cardiovascular status: blood pressure returned to baseline and stable Postop Assessment: no apparent nausea or vomiting Anesthetic complications: no    Last Vitals:  Vitals:   01/15/20 1335 01/15/20 1350  BP: (!) 134/97 129/82  Pulse: 73 76  Resp: 14 13  Temp:    SpO2: 97% 97%    Last Pain:  Vitals:   01/15/20 1335  TempSrc:   PainSc: 0-No pain                 Braylea Brancato,W. EDMOND

## 2020-01-15 NOTE — Anesthesia Preprocedure Evaluation (Addendum)
Anesthesia Evaluation  Patient identified by MRN, date of birth, ID band Patient awake    Reviewed: Allergy & Precautions, H&P , NPO status , Patient's Chart, lab work & pertinent test results  Airway Mallampati: II  TM Distance: >3 FB Neck ROM: Full    Dental no notable dental hx. (+) Teeth Intact, Dental Advisory Given   Pulmonary Current SmokerPatient did not abstain from smoking.,    Pulmonary exam normal breath sounds clear to auscultation       Cardiovascular negative cardio ROS   Rhythm:Regular Rate:Normal     Neuro/Psych negative neurological ROS  negative psych ROS   GI/Hepatic Neg liver ROS, GERD  ,  Endo/Other  negative endocrine ROS  Renal/GU negative Renal ROS  negative genitourinary   Musculoskeletal   Abdominal   Peds  Hematology negative hematology ROS (+)   Anesthesia Other Findings   Reproductive/Obstetrics negative OB ROS                            Anesthesia Physical Anesthesia Plan  ASA: II  Anesthesia Plan: General   Post-op Pain Management:    Induction: Intravenous  PONV Risk Score and Plan: 2 and Ondansetron, Dexamethasone and Midazolam  Airway Management Planned: Oral ETT  Additional Equipment:   Intra-op Plan:   Post-operative Plan: Extubation in OR  Informed Consent: I have reviewed the patients History and Physical, chart, labs and discussed the procedure including the risks, benefits and alternatives for the proposed anesthesia with the patient or authorized representative who has indicated his/her understanding and acceptance.     Dental advisory given  Plan Discussed with: CRNA  Anesthesia Plan Comments:         Anesthesia Quick Evaluation

## 2020-01-15 NOTE — Transfer of Care (Signed)
Immediate Anesthesia Transfer of Care Note  Patient: Rodney Johnson  Procedure(s) Performed: ENDOSCOPIC SINUS SURGERY (Bilateral Nose)  Patient Location: PACU  Anesthesia Type:General  Level of Consciousness: drowsy and patient cooperative  Airway & Oxygen Therapy: Patient Spontanous Breathing  Post-op Assessment: Report given to RN, Post -op Vital signs reviewed and stable and Patient moving all extremities X 4  Post vital signs: Reviewed and stable  Last Vitals:  Vitals Value Taken Time  BP 139/91 01/15/20 1235  Temp 36.5 C 01/15/20 1235  Pulse 71 01/15/20 1241  Resp 14 01/15/20 1241  SpO2 97 % 01/15/20 1241  Vitals shown include unvalidated device data.  Last Pain:  Vitals:   01/15/20 1235  TempSrc:   PainSc: (P) Asleep         Complications: No apparent anesthesia complications

## 2020-01-16 LAB — SURGICAL PATHOLOGY

## 2020-01-29 ENCOUNTER — Ambulatory Visit: Payer: 59 | Admitting: Internal Medicine

## 2020-01-29 ENCOUNTER — Other Ambulatory Visit: Payer: Self-pay

## 2020-01-29 ENCOUNTER — Encounter: Payer: Self-pay | Admitting: Internal Medicine

## 2020-01-29 VITALS — BP 137/90 | HR 108 | Temp 98.2°F | Ht 69.0 in | Wt 229.2 lb

## 2020-01-29 DIAGNOSIS — R739 Hyperglycemia, unspecified: Secondary | ICD-10-CM | POA: Diagnosis not present

## 2020-01-29 DIAGNOSIS — Z0000 Encounter for general adult medical examination without abnormal findings: Secondary | ICD-10-CM

## 2020-01-29 DIAGNOSIS — Z13220 Encounter for screening for lipoid disorders: Secondary | ICD-10-CM

## 2020-01-29 DIAGNOSIS — Z114 Encounter for screening for human immunodeficiency virus [HIV]: Secondary | ICD-10-CM

## 2020-01-29 DIAGNOSIS — F17200 Nicotine dependence, unspecified, uncomplicated: Secondary | ICD-10-CM

## 2020-01-29 DIAGNOSIS — R030 Elevated blood-pressure reading, without diagnosis of hypertension: Secondary | ICD-10-CM | POA: Diagnosis not present

## 2020-01-29 DIAGNOSIS — F17210 Nicotine dependence, cigarettes, uncomplicated: Secondary | ICD-10-CM | POA: Diagnosis not present

## 2020-01-29 DIAGNOSIS — J329 Chronic sinusitis, unspecified: Secondary | ICD-10-CM | POA: Diagnosis not present

## 2020-01-29 DIAGNOSIS — Z79899 Other long term (current) drug therapy: Secondary | ICD-10-CM | POA: Diagnosis not present

## 2020-01-29 LAB — POCT GLYCOSYLATED HEMOGLOBIN (HGB A1C): Hemoglobin A1C: 5.7 % — AB (ref 4.0–5.6)

## 2020-01-29 LAB — GLUCOSE, CAPILLARY: Glucose-Capillary: 118 mg/dL — ABNORMAL HIGH (ref 70–99)

## 2020-01-29 MED ORDER — VARENICLINE TARTRATE 1 MG PO TABS: 1.0000 mg | ORAL_TABLET | Freq: Two times a day (BID) | ORAL | 0 refills | Status: DC

## 2020-01-29 MED ORDER — VARENICLINE TARTRATE 0.5 MG X 11 & 1 MG X 42 PO MISC: ORAL | 0 refills | Status: DC

## 2020-01-29 NOTE — Assessment & Plan Note (Addendum)
Patient reports smoking 1/2 pack/day cigarettes.  He states that he smoked for the past 20 years and the highest daily amount he smoked was about 1 to 1-1/2 packs/day.  Patient states he is interested in being started on Chantix.  Plan: *Chantix ramp followed by Chantix 1 mg twice daily.  Assuming tolerability, we will continue treatment for total duration of at least 12 weeks. *2 Chantix prescriptions were sent-1 for the ramp and 1 for the maintenance dose (this latter prescription will be active in approximately 1 month).

## 2020-01-29 NOTE — Assessment & Plan Note (Addendum)
Screening blood work obtained for cholesterol, hemoglobin A1c, HIV. Patient was hyperglycemic on past BMP.

## 2020-01-29 NOTE — Assessment & Plan Note (Addendum)
On review of notes, patient had initial surgery for chronic sinusitis in October 2020 and had revision surgery via endoscopy in February 2021.  Patient reports he is recuperating well, taking Tylenol as needed for pain.  Patient counseled on safe dosing of Tylenol.  Patient to follow-up with ENT.

## 2020-01-29 NOTE — Progress Notes (Signed)
   CC: Establish care  HPI: Patient is a 39 year old male with past medical history significant for chronic sinusitis who presents to establish care.  Past Medical History:  Diagnosis Date  . GERD (gastroesophageal reflux disease)   . Sinusitis, chronic   . Wears glasses    Past Surgical History: Sinus surgery in October 2020 and February 2021  Family History: *Father, grandfather, grandmother with diabetes *Mother with hypertension  Social:  *Denies alcohol or recreational drug usage *Reports smoking 0.5 PPD cigarettes, used to smoke 1-1.5 PPD for about 20 years  Allergies: Denies known drug allergies  Review of Systems:   Review of Systems  Respiratory: Negative for shortness of breath.   Cardiovascular: Negative for chest pain.  Gastrointestinal: Negative for abdominal pain.  All other systems reviewed and are negative.  Physical Exam:  Vitals:   01/29/20 1331  BP: 137/90  Pulse: (!) 108  Temp: 98.2 F (36.8 C)  TempSrc: Oral  SpO2: 100%  Weight: 229 lb 3.2 oz (104 kg)  Height: 5\' 9"  (1.753 m)   Physical Exam  Constitutional: He is well-developed, well-nourished, and in no distress.  HENT:  Head: Normocephalic and atraumatic.  Eyes: EOM are normal. Right eye exhibits no discharge. Left eye exhibits no discharge.  Neck: No tracheal deviation present.  Cardiovascular: Normal rate and regular rhythm. Exam reveals no gallop and no friction rub.  No murmur heard. Pulmonary/Chest: Effort normal and breath sounds normal. No respiratory distress. He has no wheezes. He has no rales.  Abdominal: Soft. He exhibits no distension. There is no abdominal tenderness. There is no rebound and no guarding.  Musculoskeletal:        General: No tenderness, deformity or edema. Normal range of motion.     Cervical back: Normal range of motion.  Neurological: He is alert. Coordination normal.  Skin: Skin is warm and dry. No rash noted. He is not diaphoretic. No erythema.    Psychiatric: Memory and judgment normal.     Assessment & Plan:   See Encounters Tab for problem based charting.  Patient discussed with Dr. Daryll Drown

## 2020-01-29 NOTE — Patient Instructions (Addendum)
You were seen to establish care. Here are my recommendations.  1) We have collected blood work to check your lipids, blood sugar levels, and a check for HIV.  I will call you with these results.  2) I sent a prescription for Chantix to your pharmacy.  You will take 0.5 mg once daily for 3 days, followed by 0.5 mg twice daily for 4 days, followed by 1 mg twice daily. I have sent an additional prescription once you have run out of this initial prescription.  Thank you for allowing [place] to be part of your medical care!

## 2020-01-29 NOTE — Assessment & Plan Note (Signed)
Blood pressure elevated to 137/90 on this visit, blood pressure of 122/82 on 01/20/2020, 119/69 on 01/10/2020.  Given isolated elevation, no indication for therapy at this time.  We will continue to monitor at repeat visits.

## 2020-01-30 LAB — LIPID PANEL
Chol/HDL Ratio: 4.1 ratio (ref 0.0–5.0)
Cholesterol, Total: 153 mg/dL (ref 100–199)
HDL: 37 mg/dL — ABNORMAL LOW (ref >39)
LDL Chol Calc (NIH): 89 mg/dL (ref 0–99)
Triglycerides: 157 mg/dL — ABNORMAL HIGH (ref 0–149)
VLDL Cholesterol Cal: 27 mg/dL (ref 5–40)

## 2020-01-30 LAB — HIV ANTIBODY (ROUTINE TESTING W REFLEX): HIV Screen 4th Generation wRfx: NONREACTIVE

## 2020-02-05 NOTE — Progress Notes (Signed)
Internal Medicine Clinic Attending  Case discussed with Dr. MacLean at the time of the visit.  We reviewed the resident's history and exam and pertinent patient test results.  I agree with the assessment, diagnosis, and plan of care documented in the resident's note.    

## 2020-06-11 ENCOUNTER — Other Ambulatory Visit: Payer: Self-pay | Admitting: Otolaryngology

## 2020-06-11 ENCOUNTER — Observation Stay (HOSPITAL_COMMUNITY)
Admission: AD | Admit: 2020-06-11 | Discharge: 2020-06-13 | Disposition: A | Discharge status: Home / Self Care | Payer: 59 | Source: Ambulatory Visit | Attending: Otolaryngology | Admitting: Otolaryngology

## 2020-06-11 ENCOUNTER — Ambulatory Visit: Payer: Self-pay | Admitting: Otolaryngology

## 2020-06-11 DIAGNOSIS — H05019 Cellulitis of unspecified orbit: Secondary | ICD-10-CM | POA: Diagnosis not present

## 2020-06-11 DIAGNOSIS — J321 Chronic frontal sinusitis: Secondary | ICD-10-CM | POA: Insufficient documentation

## 2020-06-11 DIAGNOSIS — F17210 Nicotine dependence, cigarettes, uncomplicated: Secondary | ICD-10-CM | POA: Diagnosis not present

## 2020-06-11 DIAGNOSIS — Z20822 Contact with and (suspected) exposure to covid-19: Secondary | ICD-10-CM | POA: Diagnosis not present

## 2020-06-11 DIAGNOSIS — J0110 Acute frontal sinusitis, unspecified: Secondary | ICD-10-CM | POA: Diagnosis present

## 2020-06-11 MED ORDER — HYDROCODONE-ACETAMINOPHEN 5-325 MG PO TABS: 1.0000 | ORAL_TABLET | ORAL | Status: DC | PRN

## 2020-06-11 MED ORDER — PIPERACILLIN-TAZOBACTAM 3.375 G IVPB 30 MIN
3.3750 g | Freq: Once | INTRAVENOUS | Status: AC
  Administered 2020-06-12: 3.375 g via INTRAVENOUS
  Filled 2020-06-11 (×2): qty 50

## 2020-06-11 MED ORDER — VANCOMYCIN HCL IN DEXTROSE 1-5 GM/200ML-% IV SOLN
1000.0000 mg | Freq: Once | INTRAVENOUS | Status: DC
  Filled 2020-06-11: qty 200

## 2020-06-11 MED ORDER — IBUPROFEN 200 MG PO TABS: 400.0000 mg | ORAL_TABLET | Freq: Four times a day (QID) | ORAL | Status: DC | PRN

## 2020-06-11 MED ORDER — VANCOMYCIN HCL 2000 MG/400ML IV SOLN
2000.0000 mg | Freq: Once | INTRAVENOUS | Status: AC
  Administered 2020-06-12: 2000 mg via INTRAVENOUS
  Filled 2020-06-11: qty 400

## 2020-06-11 MED ORDER — PROMETHAZINE HCL 25 MG PO TABS: 12.5000 mg | ORAL_TABLET | Freq: Four times a day (QID) | ORAL | Status: DC | PRN

## 2020-06-11 MED ORDER — OXYMETAZOLINE HCL 0.05 % NA SOLN
2.0000 | Freq: Two times a day (BID) | NASAL | Status: DC
  Filled 2020-06-11: qty 30

## 2020-06-11 MED ORDER — DEXAMETHASONE SODIUM PHOSPHATE 10 MG/ML IJ SOLN
10.0000 mg | Freq: Two times a day (BID) | INTRAMUSCULAR | Status: DC
  Administered 2020-06-12 – 2020-06-13 (×4): 10 mg via INTRAVENOUS
  Filled 2020-06-11 (×4): qty 1

## 2020-06-11 MED ORDER — POTASSIUM CHLORIDE IN NACL 20-0.45 MEQ/L-% IV SOLN
INTRAVENOUS | Status: DC
  Filled 2020-06-11 (×2): qty 1000
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
## 2020-06-11 NOTE — Consult Note (Signed)
OPHTHALMOLOGY CONSULT NOTE   HPI: 39 yo M with history of chronic bilateral frontal sinusitis and large frontal ethmoid osteoma, scheduled for outpt sinus surgery tomorrow w/ ENT, who presented to ENT clinic today with periorbital swelling and pain + visual field defect / scotoma. Ophthalmology consulted to evaluate ocular/visual complaints prior to surgery.  Pt denies any vision changes currently, but reports seeing a "black pigment" in inf paracentral visual field earlier today (12 noon) for about 1.5 hrs. Pt reports visual symptoms have occurred in the past and correlates to times when the eyelid is swollen.  Pt reports awoke this morning with left lower eyelid swelling that has progressively worsened, which prompted his presentation to Dr. Janeice Robinson office today. Denies eye pain but reports a "swelling sensation" in left lower lid. +tearing earlier, but currently not tearing.  OHx: pt maintains annual exams at [facility]. Wears glasses for myopia  ORx: none   No current facility-administered medications on file prior to encounter.   Current Outpatient Medications on File Prior to Encounter  Medication Sig Dispense Refill  . clindamycin (CLEOCIN) 300 MG capsule Take 1 capsule (300 mg total) by mouth 3 (three) times daily. (Patient not taking: Reported on 01/13/2020) 42 capsule 0  . HYDROcodone-acetaminophen (NORCO) 7.5-325 MG tablet Take 1 tablet by mouth every 6 (six) hours as needed for moderate pain. 20 tablet 0  . ibuprofen (ADVIL) 200 MG tablet Take 400 mg by mouth every 8 (eight) hours as needed (for pain).     . promethazine (PHENERGAN) 25 MG suppository Place 1 suppository (25 mg total) rectally every 6 (six) hours as needed for nausea or vomiting. 12 suppository 1  . varenicline (CHANTIX PAK) 0.5 MG X 11 & 1 MG X 42 tablet Take one 0.5 mg tablet by mouth once daily for 3 days, then increase to one 0.5 mg tablet twice daily for 4 days, then increase to one 1 mg tablet twice daily. 1  each 0  . varenicline (CHANTIX) 1 MG tablet Take 1 tablet (1 mg total) by mouth 2 (two) times daily. 60 tablet 0    Past Medical History:  Diagnosis Date  . GERD (gastroesophageal reflux disease)   . Sinusitis, chronic   . Wears glasses     family history includes Cancer in his father; Diabetes in his father.  Social History   Occupational History  . Not on file  Tobacco Use  . Smoking status: Current Every Day Smoker    Packs/day: 0.50    Types: Cigarettes  . Smokeless tobacco: Never Used  Vaping Use  . Vaping Use: Never used  Substance and Sexual Activity  . Alcohol use: Yes    Comment: occasional  . Drug use: Not Currently  . Sexual activity: Yes    No Known Allergies  EXAM  Mental Status: A&O x3   Base Exam  OD  OS   VAcc (near card)  20/20 20/20  Pupils  Round; reactive; 3-61mm; no rAPD Round; reactive; 3-69mm; no rAPD  IOP  16 mmHg 17 mmHg  Motility  Full Full  External  Normal 1.5 cm round wound above medial eyebrow    Anterior Exam  OD  OS   Lids / Lashes  Normal Mild LLL edema with induration and erythema; tender to palpation  Conj / Sclera Normal  Normal; no injection  Cornea  Clear Clear  Ant Chamber  Deep Deep  Iris Normal Normal  Lens clear clear    Posterior Exam  OD  OS  Vitreous   Clear  Clear  Disc  Pink and sharp; c/d 0.2  Pink and sharp; c/d 0.2   Macula  Flat; excellent foveal reflex Flat; excellent foveal reflex  Vessels  Normal Normal  Periphery  Attached; no heme Attached; no heme   Imaging:  ENTCT (06/08/20)  FINDINGS:  Paranasal sinuses:   Frontal: Persistent complete bilateral frontal sinus opacification  with associated osteitis. Unchanged postsurgical defect in the  anterior wall of the left frontal sinus. Unchanged residual osteoma  in the left frontoethmoid junction region measuring 16 mm.   Ethmoid: Interval partial ethmoidectomies. Moderate residual mucosal  thickening bilaterally, increased posteriorly on the right  compared  to the prior study.   Maxillary: Interval left and possibly right maxillary antrostomies.  Mild left greater than right maxillary sinus mucosal thickening,  increased on the right and greatly decreased on the left compared to  the prior study. No fluid level.   Sphenoid: Interval sphenoid sinus sinusotomies. Minimal anterior  mucosal thickening bilaterally.   Right ostiomeatal unit: Opacified natural maxillary sinus ostium and  proximal ethmoid infundibulum. Patent accessory maxillary sinus  ostium or antrostomy more posteriorly.   Left ostiomeatal unit: Patent maxillary antrostomy.   Nasal passages: Left middle turbinectomy. Partial superior nasal  cavity opacification bilaterally. Intact nasal septum is midline.   Anatomy: Pneumatization superior to the anterior ethmoid notches  bilaterally. Keros II/III. Sellar sphenoid pneumatization pattern.  No dehiscence of carotid or optic canals. No onodi cell.   Other: Periapical lucencies involving bilateral maxillary molar  teeth.   IMPRESSION:  1. Interval sinus surgery with improved left maxillary sinus  aeration andpatent antrostomy.  2. Persistent chronic complete opacification of the frontal sinuses  with unchanged residual left frontoethmoid osteoma.  3. Moderate residual mucosal thickening in the ethmoid sinuses,  mildly increased posteriorly on the right.    CT Maxillofacial w/ contrast (10.24.20)  FINDINGS: Osseous: Scattered periapical lucencies noted about several bilateral maxillary and mandibular molars. No oral antral fistula. No acute facial fracture. Mandible intact with the mandibular condyles normally situated.  Orbits: Chronic fracture of the left lamina papyracea with herniation of a portion of the intraorbital fat, stable. There has been interval development of osseous dehiscence between the left frontal sinus and superior left orbit, through the orbital roof (series 9, image 25). Soft  tissue density abuts the adjacent superior rectus muscle. No definite active inflammation to suggest postseptal or intraorbital cellulitis. Globes symmetric in size and within normal limits.  Sinuses: Soft tissue opacity completely fills and opacifies the frontal sinuses bilaterally as well as the frontoethmoidal recesses. Unchanged left frontoethmoidal osteoma. Complete opacification of the left maxillary sinus and ostiomeatal unit with associated chronic osteitis. Moderate mucosal thickening about the right maxillary sinus. No air-fluid levels. Mastoid air cells and middle ear cavities are well pneumatized and free of fluid. Opacified bilateral Haller air cells noted.  Soft tissues: Asymmetric soft tissue swelling seen involving the left preseptal soft tissues. No loculated collection. No other acute soft tissue abnormality about the face.  Limited intracranial: Unremarkable.  IMPRESSION: 1. Severe chronic paranasal sinusitis involving the frontal sinuses, anterior ethmoidal air cells, and left maxillary sinus, with obstruction of the left ostiomeatal unit. 2. Interval development of osseous dehiscence from the left frontal sinus through the left orbital roof into the superior left orbit. No definite evidence for postseptal or intraorbital cellulitis. 3. Apparent asymmetric soft tissue swelling involving the left preseptal soft tissues. Clinical correlation for possible acute left preseptal cellulitis recommended.  Again, no definite postseptal or intraorbital extension.  Assessment/Plan:  1. Chronic frontal sinusitis with mild periorbital edema OS  - mild lower lid edema w/ induration and erythema  - dilated eye exam normal OU without orbital signs  - VA 20/20 OU  - IOP normal  - full EOMs  - no contraindications from an ophthalmologic standpoint to proceed with scheduled sinus surgery   Gardiner Sleeper, M.D., Ph.D. Diseases & Surgery of the Retina and  Vitreous [facility]
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
## 2020-06-11 NOTE — Progress Notes (Signed)
I spoke to Rodney Johnson in [facility], she confirmed that Mr. Talerico will be admitted tonight.

## 2020-06-12 ENCOUNTER — Other Ambulatory Visit: Payer: Self-pay

## 2020-06-12 ENCOUNTER — Encounter (HOSPITAL_COMMUNITY)
Admission: AD | Disposition: A | Discharge status: Home / Self Care | Payer: Self-pay | Source: Ambulatory Visit | Attending: Otolaryngology

## 2020-06-12 ENCOUNTER — Inpatient Hospital Stay (HOSPITAL_COMMUNITY): Payer: 59 | Admitting: Anesthesiology

## 2020-06-12 ENCOUNTER — Encounter (HOSPITAL_COMMUNITY): Payer: Self-pay | Admitting: Otolaryngology

## 2020-06-12 ENCOUNTER — Ambulatory Visit (HOSPITAL_COMMUNITY): Admission: RE | Admit: 2020-06-12 | Payer: 59 | Source: Home / Self Care | Admitting: Otolaryngology

## 2020-06-12 DIAGNOSIS — J0110 Acute frontal sinusitis, unspecified: Secondary | ICD-10-CM | POA: Diagnosis not present

## 2020-06-12 HISTORY — PX: NASAL SINUS SURGERY: SHX719

## 2020-06-12 HISTORY — PX: ETHMOIDECTOMY: SHX5197

## 2020-06-12 LAB — SURGICAL PCR SCREEN
MRSA, PCR: POSITIVE — AB
Staphylococcus aureus: POSITIVE — AB

## 2020-06-12 LAB — CBC WITH DIFFERENTIAL/PLATELET
Abs Immature Granulocytes: 0.02 10*3/uL (ref 0.00–0.07)
Basophils Absolute: 0.1 10*3/uL (ref 0.0–0.1)
Basophils Relative: 1 %
Eosinophils Absolute: 0.6 10*3/uL — ABNORMAL HIGH (ref 0.0–0.5)
Eosinophils Relative: 6 %
HCT: 44 % (ref 39.0–52.0)
Hemoglobin: 14.6 g/dL (ref 13.0–17.0)
Immature Granulocytes: 0 %
Lymphocytes Relative: 29 %
Lymphs Abs: 2.9 10*3/uL (ref 0.7–4.0)
MCH: 28.3 pg (ref 26.0–34.0)
MCHC: 33.2 g/dL (ref 30.0–36.0)
MCV: 85.4 fL (ref 80.0–100.0)
Monocytes Absolute: 0.6 10*3/uL (ref 0.1–1.0)
Monocytes Relative: 6 %
Neutro Abs: 5.7 10*3/uL (ref 1.7–7.7)
Neutrophils Relative %: 58 %
Platelets: 283 10*3/uL (ref 150–400)
RBC: 5.15 MIL/uL (ref 4.22–5.81)
RDW: 13.2 % (ref 11.5–15.5)
WBC: 9.8 10*3/uL (ref 4.0–10.5)
nRBC: 0 % (ref 0.0–0.2)

## 2020-06-12 LAB — BASIC METABOLIC PANEL
Anion gap: 8 (ref 5–15)
BUN: 16 mg/dL (ref 6–20)
CO2: 25 mmol/L (ref 22–32)
Calcium: 8.8 mg/dL — ABNORMAL LOW (ref 8.9–10.3)
Chloride: 105 mmol/L (ref 98–111)
Creatinine, Ser: 1.06 mg/dL (ref 0.61–1.24)
GFR calc Af Amer: >60 mL/min (ref >60)
GFR calc non Af Amer: >60 mL/min (ref >60)
Glucose, Bld: 109 mg/dL — ABNORMAL HIGH (ref 70–99)
Potassium: 3.9 mmol/L (ref 3.5–5.1)
Sodium: 138 mmol/L (ref 135–145)

## 2020-06-12 LAB — SARS CORONAVIRUS 2 BY RT PCR (HOSPITAL ORDER, PERFORMED IN ~~LOC~~ HOSPITAL LAB): SARS Coronavirus 2: NEGATIVE

## 2020-06-12 SURGERY — ETHMOIDECTOMY
Anesthesia: General | Site: Nose | Laterality: Left

## 2020-06-12 MED ORDER — LIDOCAINE 2% (20 MG/ML) 5 ML SYRINGE
INTRAMUSCULAR | Status: DC | PRN
  Administered 2020-06-12: 50 mg via INTRAVENOUS

## 2020-06-12 MED ORDER — 0.9 % SODIUM CHLORIDE (POUR BTL) OPTIME
TOPICAL | Status: DC | PRN
  Administered 2020-06-12: 1000 mL

## 2020-06-12 MED ORDER — DEXAMETHASONE SODIUM PHOSPHATE 10 MG/ML IJ SOLN
INTRAMUSCULAR | Status: DC | PRN
  Administered 2020-06-12: 10 mg via INTRAVENOUS

## 2020-06-12 MED ORDER — FENTANYL CITRATE (PF) 100 MCG/2ML IJ SOLN: 25.0000 ug | INTRAMUSCULAR | Status: DC | PRN

## 2020-06-12 MED ORDER — MIDAZOLAM HCL 2 MG/2ML IJ SOLN
INTRAMUSCULAR | Status: DC | PRN
  Administered 2020-06-12: 2 mg via INTRAVENOUS

## 2020-06-12 MED ORDER — OXYMETAZOLINE HCL 0.05 % NA SOLN
NASAL | Status: DC | PRN
  Administered 2020-06-12: 1

## 2020-06-12 MED ORDER — LACTATED RINGERS IV SOLN: INTRAVENOUS | Status: DC

## 2020-06-12 MED ORDER — HYDROCODONE-ACETAMINOPHEN 7.5-325 MG PO TABS
1.0000 | ORAL_TABLET | Freq: Four times a day (QID) | ORAL | Status: DC | PRN
  Administered 2020-06-12: 1 via ORAL
  Filled 2020-06-12 (×2): qty 1

## 2020-06-12 MED ORDER — MUPIROCIN 2 % EX OINT
1.0000 "application " | TOPICAL_OINTMENT | Freq: Two times a day (BID) | CUTANEOUS | Status: DC
  Administered 2020-06-12 – 2020-06-13 (×2): 1 via NASAL
  Filled 2020-06-12: qty 22

## 2020-06-12 MED ORDER — SUGAMMADEX SODIUM 200 MG/2ML IV SOLN
INTRAVENOUS | Status: DC | PRN
  Administered 2020-06-12: 200 mg via INTRAVENOUS

## 2020-06-12 MED ORDER — OXYMETAZOLINE HCL 0.05 % NA SOLN
NASAL | Status: AC
  Filled 2020-06-12: qty 30

## 2020-06-12 MED ORDER — VANCOMYCIN HCL IN DEXTROSE 1-5 GM/200ML-% IV SOLN
1000.0000 mg | Freq: Three times a day (TID) | INTRAVENOUS | Status: DC
  Administered 2020-06-12 – 2020-06-13 (×4): 1000 mg via INTRAVENOUS
  Filled 2020-06-12 (×6): qty 200

## 2020-06-12 MED ORDER — FENTANYL CITRATE (PF) 100 MCG/2ML IJ SOLN
INTRAMUSCULAR | Status: DC | PRN
  Administered 2020-06-12: 150 ug via INTRAVENOUS
  Administered 2020-06-12: 100 ug via INTRAVENOUS
  Administered 2020-06-12 (×5): 50 ug via INTRAVENOUS

## 2020-06-12 MED ORDER — PROPOFOL 10 MG/ML IV BOLUS
INTRAVENOUS | Status: DC | PRN
  Administered 2020-06-12: 200 mg via INTRAVENOUS

## 2020-06-12 MED ORDER — OXYCODONE HCL 5 MG PO TABS
ORAL_TABLET | ORAL | Status: AC
  Filled 2020-06-12: qty 1

## 2020-06-12 MED ORDER — DEXMEDETOMIDINE (PRECEDEX) IN NS 20 MCG/5ML (4 MCG/ML) IV SYRINGE
PREFILLED_SYRINGE | INTRAVENOUS | Status: DC | PRN
  Administered 2020-06-12 (×2): 8 ug via INTRAVENOUS
  Administered 2020-06-12: 4 ug via INTRAVENOUS

## 2020-06-12 MED ORDER — PROMETHAZINE HCL 12.5 MG RE SUPP: 25.0000 mg | Freq: Four times a day (QID) | RECTAL | Status: DC | PRN

## 2020-06-12 MED ORDER — ROCURONIUM BROMIDE 10 MG/ML (PF) SYRINGE
PREFILLED_SYRINGE | INTRAVENOUS | Status: DC | PRN
  Administered 2020-06-12: 55 mg via INTRAVENOUS
  Administered 2020-06-12: 25 mg via INTRAVENOUS

## 2020-06-12 MED ORDER — CHLORHEXIDINE GLUCONATE CLOTH 2 % EX PADS
6.0000 | MEDICATED_PAD | Freq: Every day | CUTANEOUS | Status: DC
  Administered 2020-06-13: 6 via TOPICAL

## 2020-06-12 MED ORDER — PIPERACILLIN-TAZOBACTAM 3.375 G IVPB
3.3750 g | Freq: Three times a day (TID) | INTRAVENOUS | Status: DC
  Administered 2020-06-12 – 2020-06-13 (×4): 3.375 g via INTRAVENOUS
  Filled 2020-06-12 (×4): qty 50

## 2020-06-12 MED ORDER — SODIUM CHLORIDE 0.9 % IR SOLN
Status: DC | PRN
  Administered 2020-06-12: 1000 mL

## 2020-06-12 MED ORDER — LIDOCAINE-EPINEPHRINE 1 %-1:100000 IJ SOLN
INTRAMUSCULAR | Status: DC | PRN
  Administered 2020-06-12: 5 mL

## 2020-06-12 MED ORDER — ONDANSETRON HCL 4 MG/2ML IJ SOLN
INTRAMUSCULAR | Status: DC | PRN
  Administered 2020-06-12: 4 mg via INTRAVENOUS

## 2020-06-12 MED ORDER — OXYCODONE HCL 5 MG PO TABS
5.0000 mg | ORAL_TABLET | Freq: Once | ORAL | Status: AC | PRN
  Administered 2020-06-12: 5 mg via ORAL

## 2020-06-12 MED ORDER — PROMETHAZINE HCL 25 MG/ML IJ SOLN: 6.2500 mg | INTRAMUSCULAR | Status: DC | PRN

## 2020-06-12 MED ORDER — CHLORHEXIDINE GLUCONATE 0.12 % MT SOLN
15.0000 mL | Freq: Once | OROMUCOSAL | Status: AC
  Administered 2020-06-12: 15 mL via OROMUCOSAL

## 2020-06-12 MED ORDER — OXYCODONE HCL 5 MG/5ML PO SOLN: 5.0000 mg | Freq: Once | ORAL | Status: AC | PRN

## 2020-06-12 SURGICAL SUPPLY — 47 items
ADH SKN CLS APL DERMABOND .7 (GAUZE/BANDAGES/DRESSINGS) ×2
ATTRACTOMAT 16X20 MAGNETIC DRP (DRAPES) IMPLANT
BLADE RAD40 ROTATE 4M 4 5PK (BLADE) IMPLANT
BLADE RAD60 ROTATE M4 4 5PK (BLADE) IMPLANT
BLADE SURG 15 STRL LF DISP TIS (BLADE) IMPLANT
BLADE SURG 15 STRL SS (BLADE)
BLADE TRICUT ROTATE M4 4 5PK (BLADE) ×3 IMPLANT
BUR ROUND FLUTED 5 RND (BURR) ×1 IMPLANT
BUR STRYKR 3.0 RD FLUT MED (BURR) ×1 IMPLANT
CANISTER SUCT 3000ML PPV (MISCELLANEOUS) ×3 IMPLANT
COVER SURGICAL LIGHT HANDLE (MISCELLANEOUS) ×1 IMPLANT
COVER WAND RF STERILE (DRAPES) ×2 IMPLANT
DERMABOND ADVANCED (GAUZE/BANDAGES/DRESSINGS) ×1
DERMABOND ADVANCED .7 DNX12 (GAUZE/BANDAGES/DRESSINGS) IMPLANT
DRAPE HALF SHEET 40X57 (DRAPES) IMPLANT
DRESSING NASAL KENNEDY 3.5X.9 (MISCELLANEOUS) IMPLANT
DRSG NASAL KENNEDY 3.5X.9 (MISCELLANEOUS)
DRSG NASOPORE 8CM (GAUZE/BANDAGES/DRESSINGS) ×1 IMPLANT
ELECT COATED BLADE 2.86 ST (ELECTRODE) ×1 IMPLANT
ELECT REM PT RETURN 9FT ADLT (ELECTROSURGICAL)
ELECTRODE REM PT RTRN 9FT ADLT (ELECTROSURGICAL) IMPLANT
FILTER ARTHROSCOPY CONVERTOR (FILTER) IMPLANT
FORCEPS BIPOLAR SPETZLER 8 1.0 (NEUROSURGERY SUPPLIES) ×1 IMPLANT
GLOVE ECLIPSE 7.5 STRL STRAW (GLOVE) ×6 IMPLANT
GOWN STRL REUS W/ TWL LRG LVL3 (GOWN DISPOSABLE) ×4 IMPLANT
GOWN STRL REUS W/TWL LRG LVL3 (GOWN DISPOSABLE) ×6
KIT BASIN OR (CUSTOM PROCEDURE TRAY) ×3 IMPLANT
KIT TURNOVER KIT B (KITS) ×3 IMPLANT
NDL PRECISIONGLIDE 27X1.5 (NEEDLE) ×2 IMPLANT
NEEDLE PRECISIONGLIDE 27X1.5 (NEEDLE) ×3 IMPLANT
NS IRRIG 1000ML POUR BTL (IV SOLUTION) ×3 IMPLANT
PAD ARMBOARD 7.5X6 YLW CONV (MISCELLANEOUS) ×6 IMPLANT
PATTIES SURGICAL .5 X3 (DISPOSABLE) ×3 IMPLANT
PENCIL FOOT CONTROL (ELECTRODE) ×1 IMPLANT
SHEATH ENDOSCRUB 0 DEG (SHEATH) IMPLANT
SHEATH ENDOSCRUB 30 DEG (SHEATH) IMPLANT
SPECIMEN JAR SMALL (MISCELLANEOUS) ×3 IMPLANT
SUT CHROMIC 3 0 PS 2 (SUTURE) ×1 IMPLANT
SUT ETHILON 8 0 BV130 4 (SUTURE) ×1 IMPLANT
SWAB COLLECTION DEVICE MRSA (MISCELLANEOUS) IMPLANT
SWAB CULTURE ESWAB REG 1ML (MISCELLANEOUS) IMPLANT
SYR 50ML SLIP (SYRINGE) IMPLANT
TOWEL GREEN STERILE FF (TOWEL DISPOSABLE) ×3 IMPLANT
TRAY ENT MC OR (CUSTOM PROCEDURE TRAY) ×3 IMPLANT
TUBE CONNECTING 12X1/4 (SUCTIONS) ×3 IMPLANT
TUBING EXTENTION W/L.L. (IV SETS) IMPLANT
WATER STERILE IRR 1000ML POUR (IV SOLUTION) ×3 IMPLANT

## 2020-06-12 NOTE — Progress Notes (Signed)
Pharmacy Antibiotic Note  Rodney Johnson is a 39 y.o. male admitted on 06/11/2020 with cellulitis.  Pharmacy has been consulted for vancomycin and zosyn dosing.  Plan: Zosyn 3.375g IV q8h (4 hour infusion).  Vancomycin 2gm IV x 1 then 1gm IV q8 F/u renal function, cultures and clinical course    Temp (24hrs), Avg:98.3 F (36.8 C), Min:98.3 F (36.8 C), Max:98.3 F (36.8 C)  Recent Labs  Lab 06/12/20 0150  WBC 9.8  CREATININE 1.06    CrCl cannot be calculated (Unknown ideal weight.).    No Known Allergies  Thank you for allowing pharmacy to be a part of this patient's care.  Excell Seltzer Poteet 06/12/2020 3:20 AM

## 2020-06-12 NOTE — Transfer of Care (Signed)
Immediate Anesthesia Transfer of Care Note  Patient: Rodney Johnson  Procedure(s) Performed: LEFT EXTERNAL ETHMOIDECTOMY (Left Face) BILATERAL ENDOSCOPIC FRONTAL SINUS SURGERY (Bilateral Nose)  Patient Location: PACU  Anesthesia Type:General  Level of Consciousness: drowsy and patient cooperative  Airway & Oxygen Therapy: Patient Spontanous Breathing and Patient connected to face mask oxygen  Post-op Assessment: Report given to RN and Post -op Vital signs reviewed and stable  Post vital signs: Reviewed and stable  Last Vitals:  Vitals Value Taken Time  BP 139/81   Temp    Pulse 66 06/12/20 1410  Resp 25 06/12/20 1410  SpO2 100 % 06/12/20 1410  Vitals shown include unvalidated device data.  Last Pain:  Vitals:   06/12/20 0754  TempSrc: Oral         Complications: No complications documented.

## 2020-06-12 NOTE — Op Note (Signed)
OPERATIVE REPORT  DATE OF SURGERY: 06/12/2020  PATIENT:  Rodney Johnson,  39 y.o. male  PRE-OPERATIVE DIAGNOSIS:  Chronic and acute frontal sinusitis with orbital cellulitis  POST-OPERATIVE DIAGNOSIS: Same  PROCEDURE:  Procedure(s): LEFT EXTERNAL ETHMOIDECTOMY BILATERAL ENDOSCOPIC FRONTAL SINUS SURGERY  SURGEON:  Beckie Salts, MD  ASSISTANTS: None  ANESTHESIA:   General   EBL: 300 ml  DRAINS: 12 French red rubber catheter  LOCAL MEDICATIONS USED: 1% Xylocaine with epinephrine  SPECIMEN: Bilateral sinus contents  COUNTS:  Correct  PROCEDURE DETAILS: The patient was taken to the operating room and placed on the operating table in the supine position. Following induction of general endotracheal anesthesia, the face was prepped and draped in standard fashion.  Oxymetazoline spray was used preoperatively in the nasal cavities.  Afrin pledgets were used as needed.  Local anesthetic was infiltrated into the superior and posterior attachments of the middle turbinates and the modified Lynch incision which was outlined on the left side.  1.  Left external frontal ethmoidectomy with removal of osteoma. There is a granuloma at the previous trephination incision site.  This was removed with a 4 x 4 gauze.  The underlying skin was just about completely healed.  A new modified Lynch incision was outlined with a marking pen and infiltrated with local anesthetic solution.  A #15 scalpel was used to incise the skin, the orbicularis muscle and down to the periosteum.  Periosteum was elevated posteriorly exposing the lamina papyracea.  A Stryker drill with a three and 5 mm round cutting bur were used to open into the ethmoid cells.  Position was confirmed by using a 30 degree nasal endoscope.  The ethmoid was opened and cleaned of inflamed granulomatous mucosa.  The lacrimal sac was preserved.  The external ethmoidectomy was continued up superiorly towards the frontal sinus.  The drill was used to  open up the lateral bone of the sinus of the frontal ethmoid duct.  An outflow tract up into the frontal sinus was identified and thick pus was obtained and suctioned out.  The drill was used to enlarge the outflow tract.  Additional osteoma was identified and was removed partially with the drill and partially with sinus forceps.  The osteoma seem to be confluent with the posterior bone of the frontonasal duct.  I did not want to thin the bone posteriorly too much so I removed adequate amount to allow a large suction to pass easily through the middle meatus up into the frontal sinus.  A 12 red rubber catheter was then passed through the nose up into the middle meatus through the ethmoid and up into the frontal nasal duct into the frontal sinus.  This was cut inside the nasal cavity.  The wound was irrigated with saline and closed in layers using interrupted 3-0 chromic on the orbicularis and running subcuticular 3-0 chromic.  Dermabond was used on the skin.  An 8-0 nylon was used as a tarsorrhaphy throughout the case.  This was then removed.  2.  Right revision frontal sinusotomy endoscopic. The right nasal cavity was treated with Afrin-soaked pledgets.  1% Xylocaine with epinephrine was infiltrated into the superior and posterior attachments of the middle turbinate.  The microdebrider was used to complete ethmoid dissection of all inflammatory soft tissue.  Exploring along the roof of the ethmoid laterally exposed a frontal duct and I was able to pass a curved suction up into the frontal sinus.  Cloudy secretions were suctioned out.  The duct  was enlarged as much as I was able to using the suction.  The bone was very thick.  The duct was then packed with a small piece of NasalPore dressing.  Both nasal cavities nasopharynx and oropharynx were suctioned blood and secretions.  Patient was awakened extubated and transferred to recovery in stable condition.    PATIENT DISPOSITION:  To PACU, stable

## 2020-06-12 NOTE — Anesthesia Procedure Notes (Signed)
Procedure Name: Intubation Date/Time: 06/12/2020 11:41 AM Performed by: Genelle Bal, CRNA Pre-anesthesia Checklist: Patient identified, Emergency Drugs available, Suction available and Patient being monitored Patient Re-evaluated:Patient Re-evaluated prior to induction Oxygen Delivery Method: Circle system utilized Preoxygenation: Pre-oxygenation with 100% oxygen Induction Type: IV induction Ventilation: Mask ventilation without difficulty Laryngoscope Size: Mac and 4 Grade View: Grade I Tube type: Oral Tube size: 7.5 mm Number of attempts: 1 Airway Equipment and Method: Stylet Placement Confirmation: ETT inserted through vocal cords under direct vision,  positive ETCO2 and breath sounds checked- equal and bilateral Secured at: 23 cm Tube secured with: Tape Dental Injury: Teeth and Oropharynx as per pre-operative assessment  Comments: Performed by Champ Mungo, SRNA.

## 2020-06-12 NOTE — Progress Notes (Signed)
Both Neurosurg and ENT paged regarding no pre procedure Covidtest ordered

## 2020-06-12 NOTE — Progress Notes (Addendum)
1025 Pt is A&O x4. Left orbital area swelling noted, tender. NPO post mn maint. To short stay. 1505 Received pt back from PACU, sleepy, denies pain at his time. Incision to left eyebrow area with skin glue dry and intact. 1800 Dr Constance Holster came in and seen pt. No post op pain med order, Dr Constance Holster notified.

## 2020-06-12 NOTE — Anesthesia Preprocedure Evaluation (Addendum)
Anesthesia Evaluation  Patient identified by MRN, date of birth, ID band Patient awake    Reviewed: Allergy & Precautions, NPO status , Patient's Chart, lab work & pertinent test results  History of Anesthesia Complications Negative for: history of anesthetic complications  Airway Mallampati: III  TM Distance: >3 FB Neck ROM: Full    Dental  (+) Dental Advisory Given, Teeth Intact   Pulmonary Current Smoker and Patient abstained from smoking.,    Pulmonary exam normal        Cardiovascular negative cardio ROS Normal cardiovascular exam     Neuro/Psych negative neurological ROS  negative psych ROS   GI/Hepatic Neg liver ROS, GERD  Controlled,  Endo/Other   Obesity   Renal/GU negative Renal ROS     Musculoskeletal negative musculoskeletal ROS (+)   Abdominal   Peds  Hematology negative hematology ROS (+)   Anesthesia Other Findings Covid test negative   Reproductive/Obstetrics                            Anesthesia Physical Anesthesia Plan  ASA: II  Anesthesia Plan: General   Post-op Pain Management:    Induction: Intravenous  PONV Risk Score and Plan: 3 and Treatment may vary due to age or medical condition, Ondansetron, Dexamethasone and Midazolam  Airway Management Planned: Oral ETT  Additional Equipment: None  Intra-op Plan:   Post-operative Plan: Extubation in OR  Informed Consent: I have reviewed the patients History and Physical, chart, labs and discussed the procedure including the risks, benefits and alternatives for the proposed anesthesia with the patient or authorized representative who has indicated his/her understanding and acceptance.     Dental advisory given  Plan Discussed with: CRNA and Anesthesiologist  Anesthesia Plan Comments:        Anesthesia Quick Evaluation

## 2020-06-12 NOTE — Anesthesia Postprocedure Evaluation (Signed)
Anesthesia Post Note  Patient: [name]  Procedure(s) Performed: LEFT EXTERNAL ETHMOIDECTOMY (Left Face) BILATERAL ENDOSCOPIC FRONTAL SINUS SURGERY (Bilateral Nose)     Patient location during evaluation: PACU Anesthesia Type: General Level of consciousness: awake and alert, oriented and patient cooperative Pain management: pain level controlled Vital Signs Assessment: post-procedure vital signs reviewed and stable Respiratory status: spontaneous breathing, nonlabored ventilation and respiratory function stable Cardiovascular status: blood pressure returned to baseline and stable Postop Assessment: no apparent nausea or vomiting and adequate PO intake Anesthetic complications: no   No complications documented.  Last Vitals:  Vitals:   06/12/20 1440 06/12/20 1510  BP: (!) 133/83 (!) 137/92  Pulse: 65 61  Resp: 22 17  Temp: 36.9 C 36.8 C  SpO2: 100% 98%    Last Pain:  Vitals:   06/12/20 1510  TempSrc: Oral                 [name],[name]

## 2020-06-12 NOTE — Interval H&P Note (Signed)
History and Physical Interval Note:  06/12/2020 9:51 AM  Rodney Johnson  has presented today for surgery, with the diagnosis of sinus infection.  The various methods of treatment have been discussed with the patient and family. After consideration of risks, benefits and other options for treatment, the patient has consented to  Procedure(s): LEFT EXTERNAL ETHMOIDECTOMY (Left) BILATERAL ENDOSCOPIC FRONTAL SINUS SURGERY (Bilateral) as a surgical intervention.  The patient's history has been reviewed, patient examined, no change in status, stable for surgery.  I have reviewed the patient's chart and labs.  Questions were answered to the patient's satisfaction.     [name]

## 2020-06-12 NOTE — Plan of Care (Signed)
  Problem: Education: Goal: Knowledge of General Education information will improve Description: Including pain rating scale, medication(s)/side effects and non-pharmacologic comfort measures Outcome: Progressing   Problem: Coping: Goal: Level of anxiety will decrease Outcome: Progressing   Problem: Pain Managment: Goal: General experience of comfort will improve Outcome: Progressing   Problem: Skin Integrity: Goal: Risk for impaired skin integrity will decrease Outcome: Progressing   

## 2020-06-13 ENCOUNTER — Encounter (HOSPITAL_COMMUNITY): Payer: Self-pay | Admitting: Otolaryngology

## 2020-06-13 DIAGNOSIS — J0110 Acute frontal sinusitis, unspecified: Secondary | ICD-10-CM | POA: Diagnosis not present

## 2020-06-13 MED ORDER — PROMETHAZINE HCL 25 MG RE SUPP
25.0000 mg | Freq: Four times a day (QID) | RECTAL | 1 refills | Status: DC | PRN
Start: 2020-06-13 — End: 2021-12-08

## 2020-06-13 MED ORDER — HYDROCODONE-ACETAMINOPHEN 7.5-325 MG PO TABS: 1.0000 | ORAL_TABLET | Freq: Four times a day (QID) | ORAL | 0 refills | Status: DC | PRN

## 2020-06-13 MED ORDER — AMOXICILLIN-POT CLAVULANATE 875-125 MG PO TABS: 1.0000 | ORAL_TABLET | Freq: Two times a day (BID) | ORAL | 0 refills | Status: AC

## 2020-06-13 NOTE — Progress Notes (Signed)
Patient discharging home. Discharge instructions explained to patient and he verbalized understanding. Took all personal belongings. No further questions or concerns voiced.  

## 2020-06-13 NOTE — Discharge Instructions (Signed)
No heavy lifting for 3 weeks.  You may shower and use soap and water. Do not use any creams, oils or ointment.  Use nasal saline spray 20-30 times each day.  Both sides.

## 2020-06-13 NOTE — Discharge Summary (Signed)
Physician Discharge Summary  Patient ID: Rodney Johnson MRN: [id number] DOB/AGE: 39-Apr-1982 39 y.o.  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 rate 19, height 5' 9.02" (1.753 m), weight (!) 100.7 kg, SpO2 98 %. PHYSICAL EXAM: Awake and alert.  No eye swelling.  Incisions are healing nicely without any signs of infection.  Disposition: Discharge disposition: 01-Home or Self Care       Discharge Instructions    Diet - low sodium heart healthy   Complete by: As directed    Increase activity slowly   Complete by: As directed    No wound care   Complete by: As directed      Allergies as of 06/13/2020   No Known Allergies     Medication List    STOP taking these medications   acetaminophen 500 MG tablet Commonly known as: TYLENOL   clindamycin 300 MG capsule Commonly known as: Cleocin   varenicline 0.5 MG X 11 & 1 MG X 42 tablet Commonly known as: CHANTIX PAK   varenicline 1 MG tablet Commonly known as: CHANTIX     TAKE these medications   amoxicillin-clavulanate 875-125 MG tablet Commonly known as: Augmentin Take 1 tablet by mouth 2 (two) times daily for 10 days.   HYDROcodone-acetaminophen 7.5-325 MG tablet Commonly known as: Norco Take 1 tablet by mouth every 6 (six) hours as needed for moderate pain.   promethazine 25 MG suppository Commonly known as: PHENERGAN Place 1 suppository (25 mg total) rectally every 6 (six) hours as needed for nausea or vomiting.        Follow-up Information    [name], MD. Call in 1 week(s).   Specialty: Otolaryngology Contact information: [address] [place] [address] [phone]               Signed: [name] 06/13/2020, 10:46 AM

## 2020-06-15 LAB — SURGICAL PATHOLOGY

## 2020-07-06 ENCOUNTER — Other Ambulatory Visit: Payer: Self-pay | Admitting: Otolaryngology

## 2020-07-06 ENCOUNTER — Ambulatory Visit
Admission: RE | Admit: 2020-07-06 | Discharge: 2020-07-06 | Disposition: A | Discharge status: Home / Self Care | Payer: 59 | Source: Ambulatory Visit | Attending: Otolaryngology | Admitting: Otolaryngology

## 2020-07-06 DIAGNOSIS — J0110 Acute frontal sinusitis, unspecified: Secondary | ICD-10-CM

## 2020-07-06 MED ORDER — IOPAMIDOL (ISOVUE-300) INJECTION 61%
75.0000 mL | Freq: Once | INTRAVENOUS | Status: AC | PRN
  Administered 2020-07-06: 75 mL via INTRAVENOUS

## 2020-08-21 ENCOUNTER — Inpatient Hospital Stay (HOSPITAL_COMMUNITY): Payer: 59

## 2020-08-21 ENCOUNTER — Inpatient Hospital Stay (HOSPITAL_COMMUNITY)
Admission: AD | Admit: 2020-08-21 | Discharge: 2020-08-24 | DRG: 153 | Disposition: A | Discharge status: Home / Self Care | Payer: 59 | Source: Ambulatory Visit | Attending: Otolaryngology | Admitting: Otolaryngology

## 2020-08-21 DIAGNOSIS — Z20822 Contact with and (suspected) exposure to covid-19: Secondary | ICD-10-CM | POA: Diagnosis present

## 2020-08-21 DIAGNOSIS — K219 Gastro-esophageal reflux disease without esophagitis: Secondary | ICD-10-CM | POA: Diagnosis present

## 2020-08-21 DIAGNOSIS — J321 Chronic frontal sinusitis: Principal | ICD-10-CM | POA: Diagnosis present

## 2020-08-21 DIAGNOSIS — F17210 Nicotine dependence, cigarettes, uncomplicated: Secondary | ICD-10-CM | POA: Diagnosis present

## 2020-08-21 DIAGNOSIS — J0110 Acute frontal sinusitis, unspecified: Secondary | ICD-10-CM | POA: Diagnosis present

## 2020-08-21 LAB — CBC WITH DIFFERENTIAL/PLATELET
Abs Immature Granulocytes: 0.06 10*3/uL (ref 0.00–0.07)
Basophils Absolute: 0.1 10*3/uL (ref 0.0–0.1)
Basophils Relative: 1 %
Eosinophils Absolute: 0.5 10*3/uL (ref 0.0–0.5)
Eosinophils Relative: 4 %
HCT: 44.9 % (ref 39.0–52.0)
Hemoglobin: 14.6 g/dL (ref 13.0–17.0)
Immature Granulocytes: 1 %
Lymphocytes Relative: 24 %
Lymphs Abs: 3 10*3/uL (ref 0.7–4.0)
MCH: 28.4 pg (ref 26.0–34.0)
MCHC: 32.5 g/dL (ref 30.0–36.0)
MCV: 87.4 fL (ref 80.0–100.0)
Monocytes Absolute: 0.8 10*3/uL (ref 0.1–1.0)
Monocytes Relative: 7 %
Neutro Abs: 8.1 10*3/uL — ABNORMAL HIGH (ref 1.7–7.7)
Neutrophils Relative %: 63 %
Platelets: 306 10*3/uL (ref 150–400)
RBC: 5.14 MIL/uL (ref 4.22–5.81)
RDW: 13.4 % (ref 11.5–15.5)
WBC: 12.5 10*3/uL — ABNORMAL HIGH (ref 4.0–10.5)
nRBC: 0 % (ref 0.0–0.2)

## 2020-08-21 LAB — RESPIRATORY PANEL BY RT PCR (FLU A&B, COVID)
Influenza A by PCR: NEGATIVE
Influenza B by PCR: NEGATIVE
SARS Coronavirus 2 by RT PCR: NEGATIVE

## 2020-08-21 LAB — MRSA PCR SCREENING: MRSA by PCR: NEGATIVE

## 2020-08-21 MED ORDER — HYDROCODONE-ACETAMINOPHEN 5-325 MG PO TABS
1.0000 | ORAL_TABLET | Freq: Four times a day (QID) | ORAL | Status: DC | PRN
  Administered 2020-08-21 – 2020-08-22 (×2): 1 via ORAL
  Filled 2020-08-21 (×3): qty 1

## 2020-08-21 MED ORDER — IOHEXOL 300 MG/ML  SOLN
75.0000 mL | Freq: Once | INTRAMUSCULAR | Status: AC | PRN
  Administered 2020-08-21: 75 mL via INTRAVENOUS

## 2020-08-21 MED ORDER — FLUTICASONE PROPIONATE 50 MCG/ACT NA SUSP
2.0000 | Freq: Every day | NASAL | Status: DC
  Administered 2020-08-21 – 2020-08-23 (×3): 2 via NASAL
  Filled 2020-08-21: qty 16

## 2020-08-21 MED ORDER — VANCOMYCIN HCL 2000 MG/400ML IV SOLN
2000.0000 mg | Freq: Once | INTRAVENOUS | Status: AC
  Administered 2020-08-21: 2000 mg via INTRAVENOUS
  Filled 2020-08-21: qty 400

## 2020-08-21 MED ORDER — ONDANSETRON HCL 4 MG PO TABS: 4.0000 mg | ORAL_TABLET | Freq: Three times a day (TID) | ORAL | Status: DC | PRN

## 2020-08-21 MED ORDER — POLYETHYLENE GLYCOL 3350 17 G PO PACK: 17.0000 g | PACK | Freq: Every day | ORAL | Status: DC | PRN

## 2020-08-21 MED ORDER — IBUPROFEN 600 MG PO TABS
600.0000 mg | ORAL_TABLET | Freq: Four times a day (QID) | ORAL | Status: DC | PRN
  Administered 2020-08-21 – 2020-08-23 (×2): 600 mg via ORAL
  Filled 2020-08-21 (×3): qty 1

## 2020-08-21 MED ORDER — ACETAMINOPHEN 160 MG/5ML PO SOLN
650.0000 mg | ORAL | Status: DC | PRN
  Administered 2020-08-22: 650 mg via ORAL
  Filled 2020-08-21: qty 20.3

## 2020-08-21 MED ORDER — PIPERACILLIN-TAZOBACTAM 3.375 G IVPB
3.3750 g | Freq: Three times a day (TID) | INTRAVENOUS | Status: DC
  Administered 2020-08-21 – 2020-08-24 (×9): 3.375 g via INTRAVENOUS
  Filled 2020-08-21 (×7): qty 50

## 2020-08-21 MED ORDER — SALINE SPRAY 0.65 % NA SOLN
4.0000 | NASAL | Status: DC | PRN
  Filled 2020-08-21: qty 44

## 2020-08-21 NOTE — Progress Notes (Signed)
Upon admission, pt states he has not received the Covid vaccine.
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
## 2020-08-21 NOTE — Progress Notes (Signed)
Pt admitted to 6N26 via ambulatory. Oriented to room and dept. Changed left frontal forehead area dressing with folded 4x4 and cleansed area.  Pt is alert and oriented x4, ambulatory, in no pain at present.Pt using IS and getting up to 2500.

## 2020-08-21 NOTE — Progress Notes (Signed)
Called pharmacy for pharmacy consult on antibiotics, lab here for lab collection.

## 2020-08-21 NOTE — Progress Notes (Signed)
Consulted with Infection Prevention about need for Covid testing and MRSA PCR, orders placed.
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
## 2020-08-21 NOTE — Progress Notes (Signed)
Pharmacy Antibiotic Note  Rodney Johnson is a 39 y.o. male admitted on 08/21/2020 with swelling and headache and known chronic frontal sinusitis.  He is s/p I&D frontal abscess on 9/30 during outpatient office visit. Pharmacy has been consulted for vancomycin and Zosyn dosing. Last SCr was 1.06 on 06/12/20, est CrCl >90 ml/min based on ABW 82.7 kg, 100 kg in July.  Plan: Vancomycin 2000 mg IV load then 1500 mg IV q12h, goal trough 10-15 mcg/ml Zosyn 3.375 g IV q8h to be infused over 4 hours BMET with am labs Monitor renal function, clinical progress, cultures/sensitivities F/U LOT and de-escalate as able Vancomycin trough as clinically indicated F/U updated height and weight     Temp (24hrs), Avg:98 F (36.7 C), Min:98 F (36.7 C), Max:98 F (36.7 C)  No results for input(s): WBC, CREATININE, LATICACIDVEN, VANCOTROUGH, VANCOPEAK, VANCORANDOM, GENTTROUGH, GENTPEAK, GENTRANDOM, TOBRATROUGH, TOBRAPEAK, TOBRARND, AMIKACINPEAK, AMIKACINTROU, AMIKACIN in the last 168 hours.  CrCl cannot be calculated (Patient's most recent lab result is older than the maximum 21 days allowed.).    No Known Allergies  Antimicrobials this admission: vancomycin 10/1 >>  Zosyn 10/1 >>   Dose adjustments this admission:   Microbiology results: 10/1 BCx: pending 10/1 MRSA PCR: neg  Thank you for involving pharmacy in this patient's care.  Renold Genta, PharmD, BCPS Clinical Pharmacist Clinical phone for 08/21/2020 until 3p is [phone] 08/21/2020 8:34 AM  **Pharmacist phone directory can be found on [email or web address] listed under [place]**

## 2020-08-22 LAB — BASIC METABOLIC PANEL
Anion gap: 8 (ref 5–15)
BUN: 10 mg/dL (ref 6–20)
CO2: 28 mmol/L (ref 22–32)
Calcium: 9 mg/dL (ref 8.9–10.3)
Chloride: 104 mmol/L (ref 98–111)
Creatinine, Ser: 1.23 mg/dL (ref 0.61–1.24)
GFR calc Af Amer: >60 mL/min (ref >60)
GFR calc non Af Amer: >60 mL/min (ref >60)
Glucose, Bld: 105 mg/dL — ABNORMAL HIGH (ref 70–99)
Potassium: 4.2 mmol/L (ref 3.5–5.1)
Sodium: 140 mmol/L (ref 135–145)

## 2020-08-22 MED ORDER — VANCOMYCIN HCL IN DEXTROSE 1-5 GM/200ML-% IV SOLN
1000.0000 mg | Freq: Three times a day (TID) | INTRAVENOUS | Status: DC
  Administered 2020-08-22 – 2020-08-24 (×6): 1000 mg via INTRAVENOUS
  Filled 2020-08-22 (×7): qty 200

## 2020-08-22 MED ORDER — DEXAMETHASONE SODIUM PHOSPHATE 10 MG/ML IJ SOLN
10.0000 mg | Freq: Three times a day (TID) | INTRAMUSCULAR | Status: AC
  Administered 2020-08-22 – 2020-08-23 (×3): 10 mg via INTRAVENOUS
  Filled 2020-08-22 (×3): qty 1

## 2020-08-22 NOTE — Progress Notes (Signed)
ENT Progress Note: HD #1   Subjective: Continued headache, minimal discharge  Objective: Vital signs in last 24 hours: Temp:  [98.1 F (36.7 C)-98.9 F (37.2 C)] 98.1 F (36.7 C) (10/02 0503) Pulse Rate:  [59-66] 59 (10/02 0503) Resp:  [16-17] 16 (10/02 0503) BP: (98-132)/(67-90) 117/77 (10/02 0503) SpO2:  [97 %-99 %] 99 % (10/02 0503) Weight:  [105.8 kg] 105.8 kg (10/01 1300) Weight change:  Last BM Date: 08/21/20  Intake/Output from previous day: 10/01 0701 - 10/02 0700 In: 0962 [P.O.:1200; IV Piggyback:123] Out: -  Intake/Output this shift: No intake/output data recorded.  Labs: Recent Labs    08/21/20 0852  WBC 12.5*  HGB 14.6  HCT 44.9  PLT 306   Recent Labs    08/22/20 0842  NA 140  K 4.2  CL 104  CO2 28  GLUCOSE 105*  BUN 10  CALCIUM 9.0    Studies/Results: CT MAXILLOFACIAL W CONTRAST  Result Date: 08/21/2020 CLINICAL DATA:  Maxillary/facial abscess. 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 COMPARISON:  Multiple prior studies most recently 07/06/2020 FINDINGS: Paranasal sinuses: Frontal: Chronic opacification of both frontal sinuses with extensive bone thickening and periosteal new bone formation. Previously noted drainage catheter from the left frontal sinus extending into the nasal cavity has been removed. Osteotomy of the outer table of the left frontal sinus is unchanged. 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 Osteotomy in the left orbital roof medially is unchanged with adjacent soft tissue thickening in the orbit unchanged. Drainage catheter in the left frontal scalp has been placed in the interval but does not extend into the sinus. There is adjacent soft tissue swelling in the scalp. No soft tissue fluid collection identified. Ethmoid: Bilateral  ethmoidectomy. Mucosal edema left greater than right. Maxillary: Medial antrostomy on the left with mild mucosal edema bilaterally. Chronic bony thickening left maxillary sinus. Sphenoid: Normally aerated. Patent sphenoethmoidal recesses. Mastoid and middle ear: Clear bilaterally Right ostiomeatal unit: Patent. Left ostiomeatal unit: Medial antrostomy is patent on the left. Nasal passages: Patent. Intact nasal septum is midline. Anatomy: Bilateral Haller cells. Limited intracranial imaging negative.  No abscess or brain edema. Mild soft tissue swelling left superomedial orbit is unchanged. No orbital abscess. Periapical lucency around left upper second and third molars, mild. Mild periapical lucency around right upper second molar and right lower molars. IMPRESSION: Interval removal of left frontal sinus drain which extended into the nasal cavity. There is now a soft tissue drain in the left frontal scalp anterior to the osteotomy. This drain does not extend into the frontal sinus. There is mild soft tissue swelling in the left frontal scalp without soft tissue abscess Persistent and chronic opacification of the frontal sinus bilaterally. Diffuse mucosal edema in the paranasal sinuses especially the left ethmoid sinus. Defect in the left superomedial orbit appears postsurgical with adjacent soft tissue swelling in the orbit unchanged. No orbital abscess. Electronically Signed   By: Franchot Gallo M.D.   On: 08/21/2020 15:44     PHYSICAL EXAM: Minimal drainage from Penrose drain in the left brow.  Dressing changed.  Normal vision and extraocular mobility, no intranasal discharge.   Assessment/Plan: Patient stable, continue current antibiotic therapy.  We will add several doses of Decadron to help swelling, headache and inflammation.  Patient will continue frequent saline irrigation and fluticasone nightly.  Monitor continued  clinical improvement.    Jerrell Belfast 08/22/2020, 10:39 AM

## 2020-08-22 NOTE — Progress Notes (Signed)
Pharmacy Antibiotic Note  Rodney Johnson is a 39 y.o. male admitted on 08/21/2020 with swelling and headache and known chronic frontal sinusitis.    He is s/p I&D frontal abscess on 9/30 during outpatient office visit. Pharmacy has been consulted for vancomycin and Zosyn dosing.  SCr now 1.23 with estimated CrCl >90 mL/min.   Plan: - Vancomycin 1000 mg IV every 8 hours per nomogram dosing, goal trough 10-15 mcg/ml - Zosyn 3.375 g IV q8h to be infused over 4 hours - Monitor renal function, clinical progress, cultures/sensitivities - Follow-up length of therapy and de-escalate as able   Height: 5\' 9"  (175.3 cm) Weight: 105.8 kg (233 lb 4 oz) IBW/kg (Calculated) : 70.7  Temp (24hrs), Avg:98.6 F (37 C), Min:98.1 F (36.7 C), Max:98.9 F (37.2 C)  Recent Labs  Lab 08/21/20 0852 08/22/20 0842  WBC 12.5*  --   CREATININE  --  1.23    Estimated Creatinine Clearance: 96.6 mL/min (by C-G formula based on SCr of 1.23 mg/dL).    No Known Allergies  Antimicrobials this admission: Vancomycin 10/1 >>  Zosyn 10/1 >>    Microbiology results: 10/1 BCx: NGTD <4 days 10/1 Flu/COVID: neg 10/1 MRSA PCR: neg     Maleiyah Releford L. Devin Going, [place] PGY2 Pharmacy Resident Weekends 7:00 am - 3:00 pm, please call [phone] 08/22/20      10:53 AM  Please check AMION for all [place] phone numbers After 10:00 PM, call the [place] [phone]

## 2020-08-23 LAB — BASIC METABOLIC PANEL
Anion gap: 13 (ref 5–15)
BUN: 12 mg/dL (ref 6–20)
CO2: 21 mmol/L — ABNORMAL LOW (ref 22–32)
Calcium: 9.1 mg/dL (ref 8.9–10.3)
Chloride: 103 mmol/L (ref 98–111)
Creatinine, Ser: 1.24 mg/dL (ref 0.61–1.24)
GFR calc Af Amer: >60 mL/min (ref >60)
GFR calc non Af Amer: >60 mL/min (ref >60)
Glucose, Bld: 232 mg/dL — ABNORMAL HIGH (ref 70–99)
Potassium: 3.7 mmol/L (ref 3.5–5.1)
Sodium: 137 mmol/L (ref 135–145)

## 2020-08-23 LAB — CBC
HCT: 45.7 % (ref 39.0–52.0)
Hemoglobin: 14.9 g/dL (ref 13.0–17.0)
MCH: 28.1 pg (ref 26.0–34.0)
MCHC: 32.6 g/dL (ref 30.0–36.0)
MCV: 86.1 fL (ref 80.0–100.0)
Platelets: 349 10*3/uL (ref 150–400)
RBC: 5.31 MIL/uL (ref 4.22–5.81)
RDW: 13.2 % (ref 11.5–15.5)
WBC: 10.2 10*3/uL (ref 4.0–10.5)
nRBC: 0 % (ref 0.0–0.2)

## 2020-08-23 NOTE — Progress Notes (Signed)
ENT Progress Note: HD #2   Subjective: Decreased headache, patient tolerating normal oral diet, minimal discharge.  Objective: Vital signs in last 24 hours: Temp:  [98.1 F (36.7 C)-98.3 F (36.8 C)] 98.3 F (36.8 C) (10/03 0523) Pulse Rate:  [62-69] 69 (10/03 0523) Resp:  [16-18] 18 (10/03 0523) BP: (119-140)/(77-85) 119/77 (10/03 0523) SpO2:  [99 %-100 %] 99 % (10/03 0523) Weight change:  Last BM Date: 08/22/20  Intake/Output from previous day: 10/02 0701 - 10/03 0700 In: 1050 [P.O.:850; IV Piggyback:200] Out: -  Intake/Output this shift: No intake/output data recorded.  Labs: Recent Labs    08/21/20 0852 08/23/20 0037  WBC 12.5* 10.2  HGB 14.6 14.9  HCT 44.9 45.7  PLT 306 349   Recent Labs    08/22/20 0842 08/23/20 0037  NA 140 137  K 4.2 3.7  CL 104 103  CO2 28 21*  GLUCOSE 105* 232*  BUN 10 12  CALCIUM 9.0 9.1    Studies/Results: CT MAXILLOFACIAL W CONTRAST  Result Date: 08/21/2020 CLINICAL DATA:  Maxillary/facial abscess. 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 COMPARISON:  Multiple prior studies most recently 07/06/2020 FINDINGS: Paranasal sinuses: Frontal: Chronic opacification of both frontal sinuses with extensive bone thickening and periosteal new bone formation. Previously noted drainage catheter from the left frontal sinus extending into the nasal cavity has been removed. Osteotomy of the outer table of the left frontal sinus is unchanged. 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 Osteotomy in the left orbital roof medially is unchanged with adjacent soft tissue thickening in the orbit unchanged. Drainage catheter in the left frontal scalp has been placed in the interval but does not extend into the sinus. There is adjacent soft tissue swelling in  the scalp. No soft tissue fluid collection identified. Ethmoid: Bilateral ethmoidectomy. Mucosal edema left greater than right. Maxillary: Medial antrostomy on the left with mild mucosal edema bilaterally. Chronic bony thickening left maxillary sinus. Sphenoid: Normally aerated. Patent sphenoethmoidal recesses. Mastoid and middle ear: Clear bilaterally Right ostiomeatal unit: Patent. Left ostiomeatal unit: Medial antrostomy is patent on the left. Nasal passages: Patent. Intact nasal septum is midline. Anatomy: Bilateral Haller cells. Limited intracranial imaging negative.  No abscess or brain edema. Mild soft tissue swelling left superomedial orbit is unchanged. No orbital abscess. Periapical lucency around left upper second and third molars, mild. Mild periapical lucency around right upper second molar and right lower molars. IMPRESSION: Interval removal of left frontal sinus drain which extended into the nasal cavity. There is now a soft tissue drain in the left frontal scalp anterior to the osteotomy. This drain does not extend into the frontal sinus. There is mild soft tissue swelling in the left frontal scalp without soft tissue abscess Persistent and chronic opacification of the frontal sinus bilaterally. Diffuse mucosal edema in the paranasal sinuses especially the left ethmoid sinus. Defect in the left superomedial orbit appears postsurgical with adjacent soft tissue swelling in the orbit unchanged. No orbital abscess. Electronically Signed   By: Franchot Gallo M.D.   On: 08/21/2020 15:44     PHYSICAL EXAM: Interval drainage from left brow Penrose.  Swelling has improved with decreased tenderness.   Assessment/Plan: Patient afebrile and white blood cell count normal this a.m.  Tolerating normal oral diet without difficulty.  Patient reports decreased headache.  If continued improvement plan discharge 10/4  high-dose Augmentin and steroid taper.  Continue saline nasal spray and fluticasone  nightly.    Jerrell Belfast 08/23/2020, 9:51 AM

## 2020-08-24 LAB — CBC
HCT: 45.2 % (ref 39.0–52.0)
Hemoglobin: 14.7 g/dL (ref 13.0–17.0)
MCH: 27.8 pg (ref 26.0–34.0)
MCHC: 32.5 g/dL (ref 30.0–36.0)
MCV: 85.6 fL (ref 80.0–100.0)
Platelets: 388 10*3/uL (ref 150–400)
RBC: 5.28 MIL/uL (ref 4.22–5.81)
RDW: 13.5 % (ref 11.5–15.5)
WBC: 24.5 10*3/uL — ABNORMAL HIGH (ref 4.0–10.5)
nRBC: 0 % (ref 0.0–0.2)

## 2020-08-24 LAB — BASIC METABOLIC PANEL
Anion gap: 7 (ref 5–15)
BUN: 22 mg/dL — ABNORMAL HIGH (ref 6–20)
CO2: 28 mmol/L (ref 22–32)
Calcium: 9.4 mg/dL (ref 8.9–10.3)
Chloride: 102 mmol/L (ref 98–111)
Creatinine, Ser: 1.2 mg/dL (ref 0.61–1.24)
GFR calc Af Amer: >60 mL/min (ref >60)
GFR calc non Af Amer: >60 mL/min (ref >60)
Glucose, Bld: 133 mg/dL — ABNORMAL HIGH (ref 70–99)
Potassium: 3.7 mmol/L (ref 3.5–5.1)
Sodium: 137 mmol/L (ref 135–145)

## 2020-08-24 MED ORDER — METHYLPREDNISOLONE 4 MG PO TBPK: ORAL_TABLET | ORAL | 0 refills | Status: DC

## 2020-08-24 MED ORDER — AMOXICILLIN-POT CLAVULANATE ER 1000-62.5 MG PO TB12: 1.0000 | ORAL_TABLET | Freq: Two times a day (BID) | ORAL | 0 refills | Status: AC

## 2020-08-24 NOTE — Progress Notes (Signed)
   ENT Progress Note: HD #3   Subjective: Significant improvement in pain and headache.  Objective: Vital signs in last 24 hours: Temp:  [98.2 F (36.8 C)-98.8 F (37.1 C)] 98.6 F (37 C) (10/04 0456) Pulse Rate:  [76-85] 78 (10/04 0456) Resp:  [17] 17 (10/04 0456) BP: (116-125)/(64-82) 124/80 (10/04 0456) SpO2:  [94 %-98 %] 98 % (10/04 0456) Weight change:  Last BM Date: 08/23/20  Intake/Output from previous day: 10/03 0701 - 10/04 0700 In: 838 [P.O.:600; IV Piggyback:238] Out: -  Intake/Output this shift: No intake/output data recorded.  Labs: Recent Labs    08/23/20 0037 08/24/20 0243  WBC 10.2 24.5*  HGB 14.9 14.7  HCT 45.7 45.2  PLT 349 388   Recent Labs    08/23/20 0037 08/24/20 0243  NA 137 137  K 3.7 3.7  CL 103 102  CO2 21* 28  GLUCOSE 232* 133*  BUN 12 22*  CALCIUM 9.1 9.4    Studies/Results: No results found.   PHYSICAL EXAM: Penrose drain removed without difficulty, minimal discharge.  Dressing reapplied.  Swelling improved.   Assessment/Plan: Patient improved with current IV antibiotic therapy.  Plan discharge this a.m.  Patient will continue on oral antibiotics and steroids as an outpatient with follow-up to see Dr. Constance Holster next week.    Jerrell Belfast 08/24/2020, 8:52 AM

## 2020-08-24 NOTE — Progress Notes (Signed)
Prescription for pain med sent to patient's community pharmacy per Dr. Victorio Palm assistant. Patient discharged. Accompanied by significant other.

## 2020-08-24 NOTE — Discharge Summary (Signed)
Physician Discharge Summary  Patient ID: Rodney Johnson MRN: [id number] DOB/AGE: [birth year] 39 y.o.  Admit date: 08/21/2020 Discharge date: 08/24/2020  Admission Diagnoses:  Active Problems:   Sinusitis, acute frontal   Acute frontal sinusitis   Discharge Diagnoses:  Same  Surgeries:  on * No surgery found *   Consultants: None  Discharged Condition: Improved  Hospital Course: Rodney Johnson is an 39 y.o. male who was admitted 08/21/2020 with a diagnosis of Active Problems:   Sinusitis, acute frontal   Acute frontal sinusitis  and went to the operating room on * No surgery found * and underwent the above named procedures.   Patient mated to the hospital for treatment of acute frontal sinus abscess.  Patient treated with broad-spectrum intravenous antibiotics and steroid medication with significant improvement in drainage.  Patient discharged home on hospital day 3 with medications and discharge instructions as below.  Recent vital signs:  Vitals:   08/23/20 2026 08/24/20 0456  BP: 125/82 124/80  Pulse: 76 78  Resp: 17 17  Temp: 98.8 F (37.1 C) 98.6 F (37 C)  SpO2: 97% 98%    Recent laboratory studies:  Results for orders placed or performed during the hospital encounter of 08/21/20  Culture, blood (routine x 2)   Specimen: BLOOD  Result Value Ref Range   Specimen Description BLOOD RIGHT ANTECUBITAL    Special Requests      BOTTLES DRAWN AEROBIC AND ANAEROBIC Blood Culture adequate volume   Culture      NO GROWTH 2 DAYS Performed at [facility] [address] [place], [place] [address]    Report Status PENDING   Culture, blood (routine x 2)   Specimen: BLOOD  Result Value Ref Range   Specimen Description BLOOD LEFT ANTECUBITAL    Special Requests      BOTTLES DRAWN AEROBIC AND ANAEROBIC Blood Culture results may not be optimal due to an inadequate volume of blood received in culture bottles   Culture      NO GROWTH 2 DAYS Performed at [facility] [address] [place],  [address]    Report Status PENDING   Respiratory Panel by RT PCR (Flu A&B, Covid) - Nasopharyngeal Swab   Specimen: Nasopharyngeal Swab  Result Value Ref Range   SARS Coronavirus 2 by RT PCR NEGATIVE NEGATIVE   Influenza A by PCR NEGATIVE NEGATIVE   Influenza B by PCR NEGATIVE NEGATIVE  MRSA PCR Screening  Result Value Ref Range   MRSA by PCR NEGATIVE NEGATIVE  CBC WITH DIFFERENTIAL  Result Value Ref Range   WBC 12.5 (H) 4.0 - 10.5 K/uL   RBC 5.14 4.22 - 5.81 MIL/uL   Hemoglobin 14.6 13.0 - 17.0 g/dL   HCT 44.9 39 - 52 %   MCV 87.4 80.0 - 100.0 fL   MCH 28.4 26.0 - 34.0 pg   MCHC 32.5 30.0 - 36.0 g/dL   RDW 13.4 11.5 - 15.5 %   Platelets 306 150 - 400 K/uL   nRBC 0.0 0.0 - 0.2 %   Neutrophils Relative % 63 %   Neutro Abs 8.1 (H) 1.7 - 7.7 K/uL   Lymphocytes Relative 24 %   Lymphs Abs 3.0 0.7 - 4.0 K/uL   Monocytes Relative 7 %   Monocytes Absolute 0.8 0 - 1 K/uL   Eosinophils Relative 4 %   Eosinophils Absolute 0.5 0 - 0 K/uL   Basophils Relative 1 %   Basophils Absolute 0.1  0 - 0 K/uL   Immature Granulocytes 1 %   Abs Immature Granulocytes 0.06 0.00 - 0.07 K/uL  Basic metabolic panel  Result Value Ref Range   Sodium 140 135 - 145 mmol/L   Potassium 4.2 3.5 - 5.1 mmol/L   Chloride 104 98 - 111 mmol/L   CO2 28 22 - 32 mmol/L   Glucose, Bld 105 (H) 70 - 99 mg/dL   BUN 10 6 - 20 mg/dL   Creatinine, Ser 1.23 0.61 - 1.24 mg/dL   Calcium 9.0 8.9 - 10.3 mg/dL   GFR calc non Af Amer >60 >60 mL/min   GFR calc Af Amer >60 >60 mL/min   Anion gap 8 5 - 15  Basic metabolic panel  Result Value Ref Range   Sodium 137 135 - 145 mmol/L   Potassium 3.7 3.5 - 5.1 mmol/L   Chloride 103 98 - 111 mmol/L   CO2 21 (L) 22 - 32 mmol/L   Glucose, Bld 232 (H) 70 - 99 mg/dL   BUN 12 6 - 20 mg/dL   Creatinine, Ser 1.24 0.61 - 1.24 mg/dL   Calcium 9.1 8.9 - 10.3 mg/dL   GFR calc non Af Amer >60 >60 mL/min   GFR calc Af Amer >60 >60 mL/min    Anion gap 13 5 - 15  CBC  Result Value Ref Range   WBC 10.2 4.0 - 10.5 K/uL   RBC 5.31 4.22 - 5.81 MIL/uL   Hemoglobin 14.9 13.0 - 17.0 g/dL   HCT 45.7 39 - 52 %   MCV 86.1 80.0 - 100.0 fL   MCH 28.1 26.0 - 34.0 pg   MCHC 32.6 30.0 - 36.0 g/dL   RDW 13.2 11.5 - 15.5 %   Platelets 349 150 - 400 K/uL   nRBC 0.0 0.0 - 0.2 %  Basic metabolic panel  Result Value Ref Range   Sodium 137 135 - 145 mmol/L   Potassium 3.7 3.5 - 5.1 mmol/L   Chloride 102 98 - 111 mmol/L   CO2 28 22 - 32 mmol/L   Glucose, Bld 133 (H) 70 - 99 mg/dL   BUN 22 (H) 6 - 20 mg/dL   Creatinine, Ser 1.20 0.61 - 1.24 mg/dL   Calcium 9.4 8.9 - 10.3 mg/dL   GFR calc non Af Amer >60 >60 mL/min   GFR calc Af Amer >60 >60 mL/min   Anion gap 7 5 - 15  CBC  Result Value Ref Range   WBC 24.5 (H) 4.0 - 10.5 K/uL   RBC 5.28 4.22 - 5.81 MIL/uL   Hemoglobin 14.7 13.0 - 17.0 g/dL   HCT 45.2 39 - 52 %   MCV 85.6 80.0 - 100.0 fL   MCH 27.8 26.0 - 34.0 pg   MCHC 32.5 30.0 - 36.0 g/dL   RDW 13.5 11.5 - 15.5 %   Platelets 388 150 - 400 K/uL   nRBC 0.0 0.0 - 0.2 %    Discharge Medications:     Diagnostic Studies: CT MAXILLOFACIAL W CONTRAST  Result Date: 08/21/2020 CLINICAL DATA:  Maxillary/facial abscess. 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 COMPARISON:  Multiple prior studies most recently 07/06/2020 FINDINGS: Paranasal sinuses: Frontal: Chronic opacification of both frontal sinuses with extensive bone thickening and periosteal new bone formation. Previously noted drainage catheter from the left frontal sinus extending into the  nasal cavity has been removed. Osteotomy of the outer table of the left frontal sinus is unchanged. 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 Osteotomy in the left orbital roof medially is unchanged  with adjacent soft tissue thickening in the orbit unchanged. Drainage catheter in the left frontal scalp has been placed in the interval but does not extend into the sinus. There is adjacent soft tissue swelling in the scalp. No soft tissue fluid collection identified. Ethmoid: Bilateral ethmoidectomy. Mucosal edema left greater than right. Maxillary: Medial antrostomy on the left with mild mucosal edema bilaterally. Chronic bony thickening left maxillary sinus. Sphenoid: Normally aerated. Patent sphenoethmoidal recesses. Mastoid and middle ear: Clear bilaterally Right ostiomeatal unit: Patent. Left ostiomeatal unit: Medial antrostomy is patent on the left. Nasal passages: Patent. Intact nasal septum is midline. Anatomy: Bilateral Haller cells. Limited intracranial imaging negative.  No abscess or brain edema. Mild soft tissue swelling left superomedial orbit is unchanged. No orbital abscess. Periapical lucency around left upper second and third molars, mild. Mild periapical lucency around right upper second molar and right lower molars. IMPRESSION: Interval removal of left frontal sinus drain which extended into the nasal cavity. There is now a soft tissue drain in the left frontal scalp anterior to the osteotomy. This drain does not extend into the frontal sinus. There is mild soft tissue swelling in the left frontal scalp without soft tissue abscess Persistent and chronic opacification of the frontal sinus bilaterally. Diffuse mucosal edema in the paranasal sinuses especially the left ethmoid sinus. Defect in the left superomedial orbit appears postsurgical with adjacent soft tissue swelling in the orbit unchanged. No orbital abscess. Electronically Signed   By: Franchot Gallo M.D.   On: 08/21/2020 15:44    Disposition: Discharge disposition: 01-Home or Self Care       Discharge Instructions    Diet - low sodium heart healthy   Complete by: As directed    Discharge wound care:   Complete by: As  directed    Change external left forehead gauze dressing twice daily for discharge.  Patient may use a Band-Aid. Nasal saline spray, 4 sprays per nostril 6 times per day and as needed. Flonase nasal steroid spray 2 puffs per nostril nightly.   Increase activity slowly   Complete by: As directed        Follow-up Information    Marianna Payment, MD Follow up.   Specialty: Internal Medicine Contact information: [address] [place] [address] [phone]        [name], MD. Schedule an appointment as soon as possible for a visit in 1 week(s).   Specialty: Otolaryngology Why: Please contact office to make follow-up appointment with Dr. Constance Holster next week. Contact information: [address] [place] [address] [phone]                Signed: Jerrell Belfast 08/24/2020, 8:58 AM

## 2020-08-24 NOTE — Progress Notes (Addendum)
Discharge instructions reviewed with patient and significant other. Patient able to answer teach back questions re: f/u visit, wound care, and new antibiotics.  Patient reports needing pain med refill. Will notify Dr. Wilburn Cornelia. Printed AVS given to patient. Wound care supplies also given to patient.

## 2020-08-26 LAB — CULTURE, BLOOD (ROUTINE X 2)
Culture: NO GROWTH
Culture: NO GROWTH
Special Requests: ADEQUATE

## 2020-09-07 ENCOUNTER — Other Ambulatory Visit (HOSPITAL_COMMUNITY)
Admission: RE | Admit: 2020-09-07 | Discharge: 2020-09-07 | Disposition: A | Discharge status: Home / Self Care | Payer: 59 | Source: Ambulatory Visit | Attending: Otolaryngology | Admitting: Otolaryngology

## 2020-09-07 DIAGNOSIS — Z01812 Encounter for preprocedural laboratory examination: Secondary | ICD-10-CM | POA: Diagnosis present

## 2020-09-07 DIAGNOSIS — Z20822 Contact with and (suspected) exposure to covid-19: Secondary | ICD-10-CM | POA: Diagnosis not present

## 2020-09-07 LAB — SARS CORONAVIRUS 2 (TAT 6-24 HRS): SARS Coronavirus 2: NEGATIVE

## 2020-09-08 ENCOUNTER — Encounter (HOSPITAL_COMMUNITY): Payer: Self-pay | Admitting: Otolaryngology

## 2020-09-08 ENCOUNTER — Other Ambulatory Visit: Payer: Self-pay

## 2020-09-08 MED ORDER — VANCOMYCIN HCL 1500 MG/300ML IV SOLN
1500.0000 mg | Freq: Once | INTRAVENOUS | Status: AC
  Administered 2020-09-09: 1500 mg via INTRAVENOUS
  Filled 2020-09-08: qty 300

## 2020-09-08 NOTE — Progress Notes (Signed)
PCP - pt unclear name of PCP Cardiologist - pt dnies   Chest x-ray - n/a EKG - n/a Stress Test - pt denies ECHO - pt denies Cardiac Cath - pt denies    Blood Thinner Instructions: n/a Aspirin Instructions: n/a    COVID TEST- 09/07/20 negative  Coronavirus Screening  Have you experienced the following symptoms:  Cough yes/no: No Fever (>100.37F)  yes/no: No Runny nose yes/no: No Sore throat yes/no: No Difficulty breathing/shortness of breath  yes/no: No  Have you or a family member traveled in the last 14 days and where? yes/no: No   If the patient indicates "YES" to the above questions, their PAT will be rescheduled to limit the exposure to others and, the surgeon will be notified. THE PATIENT WILL NEED TO BE ASYMPTOMATIC FOR 14 DAYS.   If the patient is not experiencing any of these symptoms, the PAT nurse will instruct them to NOT bring anyone with them to their appointment since they may have these symptoms or traveled as well.   Please remind your patients and families that hospital visitation restrictions are in effect and the importance of the restrictions.     Anesthesia review: n/a   -------------  SDW INSTRUCTIONS:  Your procedure is scheduled on 09/09/20.  Report to [facility] Main Entrance "A" at 8:30 A.M., and check in at the Admitting office.  Call this number if you have problems the morning of surgery: [phone]   Remember: Do not eat or drink after midnight the night before your surgery   Take these medicines the morning of surgery with A SIP OF WATER: HYDROcodone-acetaminophen (NORCO/VICODIN)  If needed  As of today, STOP taking any Aspirin (unless otherwise instructed by your surgeon), Aleve, Naproxen, Ibuprofen, Motrin, Advil, Goody's, BC's, all herbal medications, fish oil, and all vitamins.    The Morning of Surgery  Do not wear jewelry  Do not wear lotions, powders, colognes, or deodorant Men may shave face and neck.  Do not  bring valuables to the hospital.  [facility] is not responsible for any belongings or valuables.  If you are a smoker, DO NOT Smoke 24 hours prior to surgery  If you wear a CPAP at night please bring your mask the morning of surgery   Remember that you must have someone to transport you home after your surgery, and remain with you for 24 hours if you are discharged the same day.   Please bring cases for contacts, glasses, hearing aids, dentures or bridgework because it cannot be worn into surgery.    Leave your suitcase in the car.  After surgery it may be brought to your room.  For patients admitted to the hospital, discharge time will be determined by your treatment team.  Patients discharged the day of surgery will not be allowed to drive home.    Special instructions:   [facility]- Preparing For Surgery  Oral Hygiene is also important to reduce your risk of infection.  Remember - BRUSH YOUR TEETH THE MORNING OF SURGERY WITH YOUR REGULAR TOOTHPASTE  Please follow these instructions carefully.   1. Shower the NIGHT BEFORE SURGERY and the MORNING OF SURGERY with DIAL Soap.   2. Wash thoroughly, paying special attention to the area where your surgery will be performed.  3. Thoroughly rinse your body with warm water from the neck down.  4. Pat yourself dry with a CLEAN TOWEL.  5. Wear CLEAN PAJAMAS to bed the night before surgery  6. Place  CLEAN SHEETS on your bed the night of your first shower and DO NOT SLEEP WITH PETS.  7. Wear comfortable clothes the morning of surgery.    Day of Surgery:  Please shower the morning of surgery with the DIAL soap Do not apply any deodorants/lotions. Please wear clean clothes to the hospital/surgery center.   Remember to brush your teeth WITH YOUR REGULAR TOOTHPASTE.   Please read over the following fact sheets that you were given.  Patient denies shortness of breath, fever, cough and chest pain.

## 2020-09-09 ENCOUNTER — Ambulatory Visit (HOSPITAL_COMMUNITY): Payer: 59 | Admitting: Anesthesiology

## 2020-09-09 ENCOUNTER — Other Ambulatory Visit: Payer: Self-pay

## 2020-09-09 ENCOUNTER — Encounter (HOSPITAL_COMMUNITY): Payer: Self-pay | Admitting: Otolaryngology

## 2020-09-09 ENCOUNTER — Ambulatory Visit (HOSPITAL_COMMUNITY)
Admission: RE | Admit: 2020-09-09 | Discharge: 2020-09-09 | Disposition: A | Discharge status: Home / Self Care | Payer: 59 | Attending: Otolaryngology | Admitting: Otolaryngology

## 2020-09-09 ENCOUNTER — Encounter (HOSPITAL_COMMUNITY)
Admission: RE | Disposition: A | Discharge status: Home / Self Care | Payer: Self-pay | Source: Home / Self Care | Attending: Otolaryngology

## 2020-09-09 DIAGNOSIS — F17210 Nicotine dependence, cigarettes, uncomplicated: Secondary | ICD-10-CM | POA: Insufficient documentation

## 2020-09-09 DIAGNOSIS — D169 Benign neoplasm of bone and articular cartilage, unspecified: Secondary | ICD-10-CM | POA: Diagnosis not present

## 2020-09-09 DIAGNOSIS — J321 Chronic frontal sinusitis: Secondary | ICD-10-CM | POA: Diagnosis present

## 2020-09-09 HISTORY — PX: NASAL SINUS SURGERY: SHX719

## 2020-09-09 HISTORY — PX: SINUS ENDO WITH FUSION: SHX5329

## 2020-09-09 LAB — CBC
HCT: 46.3 % (ref 39.0–52.0)
Hemoglobin: 15.3 g/dL (ref 13.0–17.0)
MCH: 29 pg (ref 26.0–34.0)
MCHC: 33 g/dL (ref 30.0–36.0)
MCV: 87.7 fL (ref 80.0–100.0)
Platelets: 243 10*3/uL (ref 150–400)
RBC: 5.28 MIL/uL (ref 4.22–5.81)
RDW: 13.6 % (ref 11.5–15.5)
WBC: 9.1 10*3/uL (ref 4.0–10.5)
nRBC: 0 % (ref 0.0–0.2)

## 2020-09-09 LAB — SURGICAL PCR SCREEN
MRSA, PCR: NEGATIVE
Staphylococcus aureus: NEGATIVE

## 2020-09-09 SURGERY — SINUS SURGERY, ENDOSCOPIC
Anesthesia: General | Site: Nose

## 2020-09-09 MED ORDER — ROCURONIUM BROMIDE 10 MG/ML (PF) SYRINGE
PREFILLED_SYRINGE | INTRAVENOUS | Status: DC | PRN
  Administered 2020-09-09 (×2): 20 mg via INTRAVENOUS
  Administered 2020-09-09: 60 mg via INTRAVENOUS

## 2020-09-09 MED ORDER — HYDROCODONE-ACETAMINOPHEN 7.5-325 MG PO TABS
1.0000 | ORAL_TABLET | Freq: Four times a day (QID) | ORAL | 0 refills | Status: DC | PRN
Start: 2020-09-09 — End: 2021-12-08

## 2020-09-09 MED ORDER — ONDANSETRON HCL 4 MG/2ML IJ SOLN: 4.0000 mg | Freq: Four times a day (QID) | INTRAMUSCULAR | Status: DC | PRN

## 2020-09-09 MED ORDER — PROMETHAZINE HCL 25 MG RE SUPP: 25.0000 mg | Freq: Four times a day (QID) | RECTAL | 1 refills | Status: DC | PRN

## 2020-09-09 MED ORDER — FENTANYL CITRATE (PF) 100 MCG/2ML IJ SOLN: 25.0000 ug | INTRAMUSCULAR | Status: DC | PRN

## 2020-09-09 MED ORDER — ARTIFICIAL TEARS OPHTHALMIC OINT
TOPICAL_OINTMENT | OPHTHALMIC | Status: DC | PRN
  Administered 2020-09-09: 1 via OPHTHALMIC

## 2020-09-09 MED ORDER — OXYMETAZOLINE HCL 0.05 % NA SOLN
NASAL | Status: AC
  Filled 2020-09-09: qty 30

## 2020-09-09 MED ORDER — MIDAZOLAM HCL 2 MG/2ML IJ SOLN
INTRAMUSCULAR | Status: AC
  Filled 2020-09-09: qty 2

## 2020-09-09 MED ORDER — LACTATED RINGERS IV SOLN: INTRAVENOUS | Status: DC

## 2020-09-09 MED ORDER — ONDANSETRON HCL 4 MG/2ML IJ SOLN
INTRAMUSCULAR | Status: DC | PRN
  Administered 2020-09-09: 4 mg via INTRAVENOUS

## 2020-09-09 MED ORDER — ACETAMINOPHEN 10 MG/ML IV SOLN
INTRAVENOUS | Status: AC
  Filled 2020-09-09: qty 100

## 2020-09-09 MED ORDER — DEXAMETHASONE SODIUM PHOSPHATE 10 MG/ML IJ SOLN
INTRAMUSCULAR | Status: AC
  Filled 2020-09-09: qty 1

## 2020-09-09 MED ORDER — OXYCODONE HCL 5 MG PO TABS
ORAL_TABLET | ORAL | Status: AC
  Administered 2020-09-09: 5 mg via ORAL
  Filled 2020-09-09: qty 1

## 2020-09-09 MED ORDER — PROPOFOL 10 MG/ML IV BOLUS
INTRAVENOUS | Status: DC | PRN
  Administered 2020-09-09: 30 mg via INTRAVENOUS
  Administered 2020-09-09: 150 mg via INTRAVENOUS
  Administered 2020-09-09: 20 mg via INTRAVENOUS

## 2020-09-09 MED ORDER — CLINDAMYCIN HCL 300 MG PO CAPS: 300.0000 mg | ORAL_CAPSULE | Freq: Three times a day (TID) | ORAL | 0 refills | Status: DC

## 2020-09-09 MED ORDER — OXYCODONE HCL 5 MG PO TABS: 5.0000 mg | ORAL_TABLET | Freq: Once | ORAL | Status: AC | PRN

## 2020-09-09 MED ORDER — ACETAMINOPHEN 10 MG/ML IV SOLN
INTRAVENOUS | Status: DC | PRN
  Administered 2020-09-09: 1000 mg via INTRAVENOUS

## 2020-09-09 MED ORDER — 0.9 % SODIUM CHLORIDE (POUR BTL) OPTIME
TOPICAL | Status: DC | PRN
  Administered 2020-09-09: 1000 mL

## 2020-09-09 MED ORDER — FENTANYL CITRATE (PF) 250 MCG/5ML IJ SOLN
INTRAMUSCULAR | Status: AC
  Filled 2020-09-09: qty 5

## 2020-09-09 MED ORDER — SODIUM CHLORIDE 0.9 % IR SOLN
Status: DC | PRN
  Administered 2020-09-09 (×2): 1000 mL

## 2020-09-09 MED ORDER — STERILE WATER FOR IRRIGATION IR SOLN
Status: DC | PRN
  Administered 2020-09-09: 1000 mL

## 2020-09-09 MED ORDER — FENTANYL CITRATE (PF) 250 MCG/5ML IJ SOLN
INTRAMUSCULAR | Status: DC | PRN
  Administered 2020-09-09 (×2): 25 ug via INTRAVENOUS
  Administered 2020-09-09: 150 ug via INTRAVENOUS
  Administered 2020-09-09 (×2): 25 ug via INTRAVENOUS

## 2020-09-09 MED ORDER — BACITRACIN ZINC 500 UNIT/GM EX OINT
TOPICAL_OINTMENT | CUTANEOUS | Status: AC
  Filled 2020-09-09: qty 28.35

## 2020-09-09 MED ORDER — CHLORHEXIDINE GLUCONATE 0.12 % MT SOLN: 15.0000 mL | Freq: Once | OROMUCOSAL | Status: AC

## 2020-09-09 MED ORDER — MIDAZOLAM HCL 5 MG/5ML IJ SOLN
INTRAMUSCULAR | Status: DC | PRN
  Administered 2020-09-09: 2 mg via INTRAVENOUS

## 2020-09-09 MED ORDER — CHLORHEXIDINE GLUCONATE 0.12 % MT SOLN
OROMUCOSAL | Status: AC
  Administered 2020-09-09: 15 mL via OROMUCOSAL
  Filled 2020-09-09: qty 15

## 2020-09-09 MED ORDER — BACITRACIN ZINC 500 UNIT/GM EX OINT
TOPICAL_OINTMENT | CUTANEOUS | Status: DC | PRN
  Administered 2020-09-09: 1 via TOPICAL

## 2020-09-09 MED ORDER — LIDOCAINE 2% (20 MG/ML) 5 ML SYRINGE
INTRAMUSCULAR | Status: AC
  Filled 2020-09-09: qty 5

## 2020-09-09 MED ORDER — ROCURONIUM BROMIDE 10 MG/ML (PF) SYRINGE
PREFILLED_SYRINGE | INTRAVENOUS | Status: AC
  Filled 2020-09-09: qty 10

## 2020-09-09 MED ORDER — DEXAMETHASONE SODIUM PHOSPHATE 10 MG/ML IJ SOLN
INTRAMUSCULAR | Status: DC | PRN
  Administered 2020-09-09: 8 mg via INTRAVENOUS

## 2020-09-09 MED ORDER — PROPOFOL 10 MG/ML IV BOLUS
INTRAVENOUS | Status: AC
  Filled 2020-09-09: qty 20

## 2020-09-09 MED ORDER — ORAL CARE MOUTH RINSE: 15.0000 mL | Freq: Once | OROMUCOSAL | Status: AC

## 2020-09-09 MED ORDER — OXYCODONE HCL 5 MG/5ML PO SOLN: 5.0000 mg | Freq: Once | ORAL | Status: AC | PRN

## 2020-09-09 MED ORDER — SUGAMMADEX SODIUM 200 MG/2ML IV SOLN
INTRAVENOUS | Status: DC | PRN
  Administered 2020-09-09: 200 mg via INTRAVENOUS

## 2020-09-09 MED ORDER — OXYMETAZOLINE HCL 0.05 % NA SOLN
2.0000 | NASAL | Status: DC
  Administered 2020-09-09: 2 via NASAL
  Filled 2020-09-09: qty 30

## 2020-09-09 MED ORDER — CLINDAMYCIN PHOSPHATE 900 MG/50ML IV SOLN
900.0000 mg | INTRAVENOUS | Status: DC
  Filled 2020-09-09: qty 50

## 2020-09-09 MED ORDER — DEXMEDETOMIDINE (PRECEDEX) IN NS 20 MCG/5ML (4 MCG/ML) IV SYRINGE
PREFILLED_SYRINGE | INTRAVENOUS | Status: DC | PRN
  Administered 2020-09-09: 12 ug via INTRAVENOUS
  Administered 2020-09-09 (×2): 4 ug via INTRAVENOUS

## 2020-09-09 MED ORDER — LIDOCAINE-EPINEPHRINE 1 %-1:100000 IJ SOLN
INTRAMUSCULAR | Status: AC
  Filled 2020-09-09: qty 1

## 2020-09-09 MED ORDER — LIDOCAINE 2% (20 MG/ML) 5 ML SYRINGE
INTRAMUSCULAR | Status: DC | PRN
  Administered 2020-09-09: 80 mg via INTRAVENOUS

## 2020-09-09 MED ORDER — LIDOCAINE-EPINEPHRINE 1 %-1:100000 IJ SOLN
INTRAMUSCULAR | Status: DC | PRN
  Administered 2020-09-09: 5 mL

## 2020-09-09 MED ORDER — OXYMETAZOLINE HCL 0.05 % NA SOLN
NASAL | Status: DC | PRN
  Administered 2020-09-09 (×2): 1 via TOPICAL

## 2020-09-09 MED ORDER — ONDANSETRON HCL 4 MG/2ML IJ SOLN
INTRAMUSCULAR | Status: AC
  Filled 2020-09-09: qty 2

## 2020-09-09 SURGICAL SUPPLY — 53 items
ATTRACTOMAT 16X20 MAGNETIC DRP (DRAPES) IMPLANT
BLADE RAD40 ROTATE 4M 4 5PK (BLADE) IMPLANT
BLADE RAD60 ROTATE M4 4 5PK (BLADE) IMPLANT
BLADE ROTATE TRICUT 4X13 M4 (BLADE) ×1 IMPLANT
BLADE SURG 15 STRL LF DISP TIS (BLADE) IMPLANT
BLADE SURG 15 STRL SS (BLADE)
BLADE TRICUT ROTATE M4 4 5PK (BLADE) ×3 IMPLANT
BUR CROSS CUT FISSURE 1.6 (BURR) IMPLANT
BUR CUTTER 5.0 COARSE DIAMOND (BURR) ×1 IMPLANT
BUR DIAMOND CURV 15X5 15D (BURR) ×3 IMPLANT
BUR PRECISION ROUND 4.0 (BUR) ×1 IMPLANT
CANISTER SUCT 3000ML PPV (MISCELLANEOUS) ×3 IMPLANT
CORD BIPOLAR FORCEPS 12FT (ELECTRODE) ×3 IMPLANT
COVER WAND RF STERILE (DRAPES) ×3 IMPLANT
DRAPE HALF SHEET 40X57 (DRAPES) IMPLANT
DRESSING NASAL KENNEDY 3.5X.9 (MISCELLANEOUS) IMPLANT
DRSG NASAL KENNEDY 3.5X.9 (MISCELLANEOUS)
DRSG NASOPORE 8CM (GAUZE/BANDAGES/DRESSINGS) ×1 IMPLANT
ELECT REM PT RETURN 9FT ADLT (ELECTROSURGICAL)
ELECTRODE REM PT RTRN 9FT ADLT (ELECTROSURGICAL) IMPLANT
FILTER ARTHROSCOPY CONVERTOR (FILTER) IMPLANT
FORCEPS BIPOLAR SPETZLER 8 1.0 (NEUROSURGERY SUPPLIES) ×3 IMPLANT
GAUZE 4X4 16PLY RFD (DISPOSABLE) ×1 IMPLANT
GLOVE ECLIPSE 7.5 STRL STRAW (GLOVE) ×6 IMPLANT
GOWN STRL REUS W/ TWL LRG LVL3 (GOWN DISPOSABLE) ×4 IMPLANT
GOWN STRL REUS W/TWL LRG LVL3 (GOWN DISPOSABLE) ×6
KIT BASIN OR (CUSTOM PROCEDURE TRAY) ×3 IMPLANT
KIT TURNOVER KIT B (KITS) ×3 IMPLANT
NDL PRECISIONGLIDE 27X1.5 (NEEDLE) ×2 IMPLANT
NEEDLE PRECISIONGLIDE 27X1.5 (NEEDLE) ×3 IMPLANT
NS IRRIG 1000ML POUR BTL (IV SOLUTION) ×3 IMPLANT
PAD ARMBOARD 7.5X6 YLW CONV (MISCELLANEOUS) ×6 IMPLANT
PATTIES SURGICAL .5 X3 (DISPOSABLE) ×5 IMPLANT
SHEATH ENDOSCRUB 0 DEG (SHEATH) IMPLANT
SHEATH ENDOSCRUB 30 DEG (SHEATH) IMPLANT
SHEET SILICONE 2X3 0.02 REINF (MISCELLANEOUS) ×1 IMPLANT
SLEEVE IRRIGATION ELITE 7 (MISCELLANEOUS) IMPLANT
SPECIMEN JAR SMALL (MISCELLANEOUS) ×3 IMPLANT
SPONGE GAUZE 2X2 8PLY STRL LF (GAUZE/BANDAGES/DRESSINGS) ×1 IMPLANT
STAPLER VISISTAT 35W (STAPLE) ×3 IMPLANT
SUT ETHILON 2 0 FS 18 (SUTURE) ×2 IMPLANT
SWAB COLLECTION DEVICE MRSA (MISCELLANEOUS) IMPLANT
SWAB CULTURE ESWAB REG 1ML (MISCELLANEOUS) IMPLANT
SYR 50ML SLIP (SYRINGE) IMPLANT
TOWEL GREEN STERILE FF (TOWEL DISPOSABLE) ×3 IMPLANT
TRACKER ENT INSTRUMENT (MISCELLANEOUS) ×4 IMPLANT
TRACKER ENT PATIENT (MISCELLANEOUS) ×3 IMPLANT
TRAY ENT MC OR (CUSTOM PROCEDURE TRAY) ×3 IMPLANT
TUBE CONNECTING 12X1/4 (SUCTIONS) ×3 IMPLANT
TUBING EXTENTION W/L.L. (IV SETS) IMPLANT
TUBING IRRIGATION (MISCELLANEOUS) IMPLANT
TUBING STRAIGHTSHOT EPS 5PK (TUBING) ×3 IMPLANT
WATER STERILE IRR 1000ML POUR (IV SOLUTION) ×3 IMPLANT

## 2020-09-09 NOTE — Anesthesia Postprocedure Evaluation (Signed)
Anesthesia Post Note  Patient: Rodney Johnson  Procedure(s) Performed: COMBINED ENDOSCOPIC AND EXTERNAL FRONTAL SINUS SURGERY; LOTHROP PROCEDURE (Bilateral Nose) SINUS ENDO WITH FUSION (N/A )     Patient location during evaluation: PACU Anesthesia Type: General Level of consciousness: sedated Pain management: pain level controlled Vital Signs Assessment: post-procedure vital signs reviewed and stable Respiratory status: spontaneous breathing and respiratory function stable Cardiovascular status: stable Postop Assessment: no apparent nausea or vomiting Anesthetic complications: no   No complications documented.  Last Vitals:  Vitals:   09/09/20 1715 09/09/20 1730  BP: 124/84 (!) 129/94  Pulse: 70 70  Resp: 12 11  Temp:  (!) 36.3 C  SpO2: 97% 97%    Last Pain:  Vitals:   09/09/20 1730  TempSrc:   PainSc: 5                  [name] DANIEL

## 2020-09-09 NOTE — Anesthesia Preprocedure Evaluation (Addendum)
Anesthesia Evaluation  Patient identified by MRN, date of birth, ID band Patient awake    Reviewed: Allergy & Precautions, H&P , NPO status , Patient's Chart, lab work & pertinent test results  Airway Mallampati: II   Neck ROM: full    Dental   Pulmonary Current Smoker,    breath sounds clear to auscultation       Cardiovascular negative cardio ROS   Rhythm:regular Rate:Normal     Neuro/Psych    GI/Hepatic GERD  ,  Endo/Other    Renal/GU      Musculoskeletal   Abdominal   Peds  Hematology   Anesthesia Other Findings   Reproductive/Obstetrics                             Anesthesia Physical Anesthesia Plan  ASA: II  Anesthesia Plan: General   Post-op Pain Management:    Induction: Intravenous  PONV Risk Score and Plan: 1 and Ondansetron, Dexamethasone, Midazolam and Treatment may vary due to age or medical condition  Airway Management Planned: Oral ETT  Additional Equipment:   Intra-op Plan:   Post-operative Plan: Extubation in OR  Informed Consent: I have reviewed the patients History and Physical, chart, labs and discussed the procedure including the risks, benefits and alternatives for the proposed anesthesia with the patient or authorized representative who has indicated his/her understanding and acceptance.       Plan Discussed with: CRNA, Anesthesiologist and Surgeon  Anesthesia Plan Comments:         Anesthesia Quick Evaluation

## 2020-09-09 NOTE — Transfer of Care (Signed)
Immediate Anesthesia Transfer of Care Note  Patient: Rodney Johnson  Procedure(s) Performed: COMBINED ENDOSCOPIC AND EXTERNAL FRONTAL SINUS SURGERY; LOTHROP PROCEDURE (Bilateral Nose) SINUS ENDO WITH FUSION (N/A )  Patient Location: PACU  Anesthesia Type:General  Level of Consciousness: drowsy  Airway & Oxygen Therapy: Patient Spontanous Breathing and Patient connected to nasal cannula oxygen  Post-op Assessment: Report given to RN and Post -op Vital signs reviewed and stable  Post vital signs: Reviewed and stable  Last Vitals:  Vitals Value Taken Time  BP 133/86 09/09/20 1612  Temp    Pulse 71 09/09/20 1612  Resp 12 09/09/20 1612  SpO2 95 % 09/09/20 1612  Vitals shown include unvalidated device data.  Last Pain:  Vitals:   09/09/20 0913  TempSrc:   PainSc: 4       Patients Stated Pain Goal: 5 (11/22/70 5366)  Complications: No complications documented.

## 2020-09-09 NOTE — Op Note (Signed)
OPERATIVE REPORT  DATE OF SURGERY: 09/09/2020  PATIENT:  Rodney Johnson,  39 y.o. 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 Following induction of general endotracheal anesthesia, the face was prepped and draped in a standard fashion.  Oxymetazoline spray was used preoperatively in the nasal cavities.  1% Xylocaine with epinephrine was infiltrated into the upper septum, the upper lateral nasal vault mucosa, and the middle turbinate attachments.  Afrin pledgets were used throughout the case.  The fusion forehead scalp registration application was applied and the system was registered.  This was used throughout the case using straight and curved suction as well as initially with the microdebrider.  Initially the right nasal cavity was inspected and the ethmoid dissection was completed.  All septations were taken down all the way up to the fovea and laterally to the lamina papyracea which were both intact.  The anterior aspect of the middle turbinate was resected back to the base of skull.  There is no evidence of CSF leak.  Mucosa was denuded off of the upper bony septum and the lateral nasal wall at the frontal process of the maxilla.  The septum was partially resected possibly 1/2 cm up to the anterior base of skull stopping at the origin of the olfactory groove.  At this point the frontal floor was entered by using a 15 degree  course diamond 5 mm bur broadly from side to side.  The sinus was entered on both sides.  The intersinus septum was deflected significantly towards the right.  This was partially taken down.  On the left side there was inflamed mucosa and granulation tissue as well as residual osteoma.  The osteoma was drilled down and eventually removed.  Sinus was then widely patent.  The left side was irrigated with saline.  All the dissection was accomplished from the right side given the previous external frontal ethmoidectomy and exposure of the periorbita.  An adequate common ostium was created.  This was packed with nasal pore dressing that was cut to size and shape with 2 thin limbs going up into each frontal sinus.  The nasal cavity and the oropharynx were suctioned blood and secretions.  Patient was awakened extubated and transferred to recovery in stable condition.  The image navigation system was useful in this procedure in order to identify the floor of the frontal sinus safely without inadvertently damaging the anterior base of skull.    PATIENT DISPOSITION:  To PACU, stable

## 2020-09-09 NOTE — Interval H&P Note (Signed)
History and Physical Interval Note:  09/09/2020 11:27 AM  Rodney Johnson  has presented today for surgery, with the diagnosis of ACCUTE FRONTAL SINUSITIS.  The various methods of treatment have been discussed with the patient and family. After consideration of risks, benefits and other options for treatment, the patient has consented to  Procedure(s): ENDOSCOPIC FRONTAL SINUS SURGERY/ MODIFIED LOTHROP PRODEDURE (N/A) SINUS ENDO WITH FUSION (N/A) as a surgical intervention.  The patient's history has been reviewed, patient examined, no change in status, stable for surgery.  I have reviewed the patient's chart and labs.  Questions were answered to the patient's satisfaction.     [name]

## 2020-09-09 NOTE — Anesthesia Procedure Notes (Signed)
Procedure Name: Intubation Performed by: Milford Cage, CRNA Pre-anesthesia Checklist: Patient identified, Emergency Drugs available, Suction available and Patient being monitored Patient Re-evaluated:Patient Re-evaluated prior to induction Oxygen Delivery Method: Circle System Utilized Preoxygenation: Pre-oxygenation with 100% oxygen Induction Type: IV induction Ventilation: Mask ventilation without difficulty and Oral airway inserted - appropriate to patient size Laryngoscope Size: Mac and 4 Grade View: Grade II Tube type: Oral Tube size: 7.0 mm Number of attempts: 1 Airway Equipment and Method: Stylet and Oral airway Placement Confirmation: ETT inserted through vocal cords under direct vision,  positive ETCO2 and breath sounds checked- equal and bilateral Secured at: 23 cm Tube secured with: Tape Dental Injury: Teeth and Oropharynx as per pre-operative assessment

## 2020-09-09 NOTE — Progress Notes (Signed)
Dr. Constance Holster stated not to put profend in pt's nostrils.

## 2020-09-11 LAB — SURGICAL PATHOLOGY

## 2020-09-12 ENCOUNTER — Encounter (HOSPITAL_COMMUNITY): Payer: Self-pay | Admitting: Otolaryngology

## 2021-01-28 IMAGING — CT CT MAXILLOFACIAL W/ CM
3 of 6 series · 14 of 47 positions shown, 17 images · IV contrast (iopamidol)
Comparison: None.

CLINICAL DATA: Left-sided facial swelling, sinus surgery May 2020

EXAM:
CT MAXILLOFACIAL WITH CONTRAST
TECHNIQUE: Multidetector CT imaging of the maxillofacial structures was
performed with intravenous contrast. Multiplanar CT image
reconstructions were also generated.
CONTRAST:  75mL [id number] IOPAMIDOL (M8C42N-U00) INJECTION 61%

[Series 2: maxillofacial 2.00 hr60 s3 axial · axial · 0.42mm/px · z∈[-736,-604]mm · 9 of 78 slices shown, 12 images]
[im 6/78  brain]
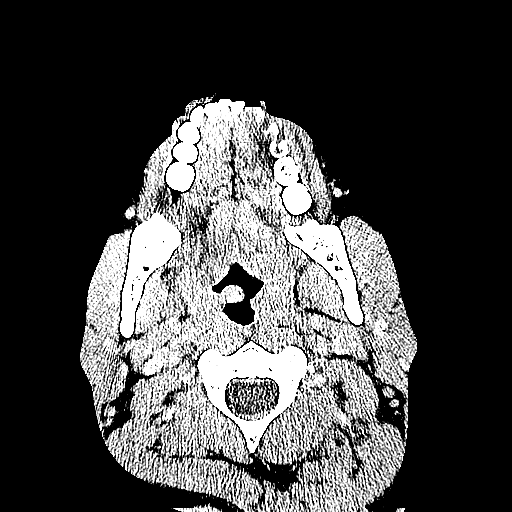
[im 6/78  bone]
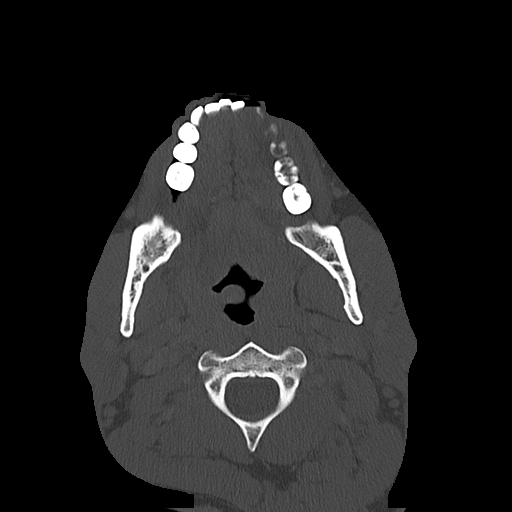
[im 17/78  bone]
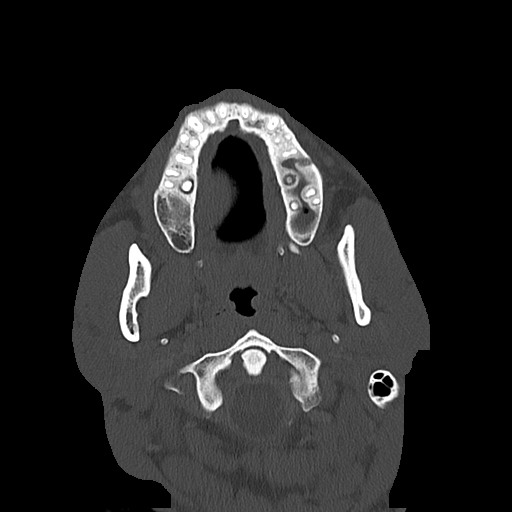
[im 23/78  bone]
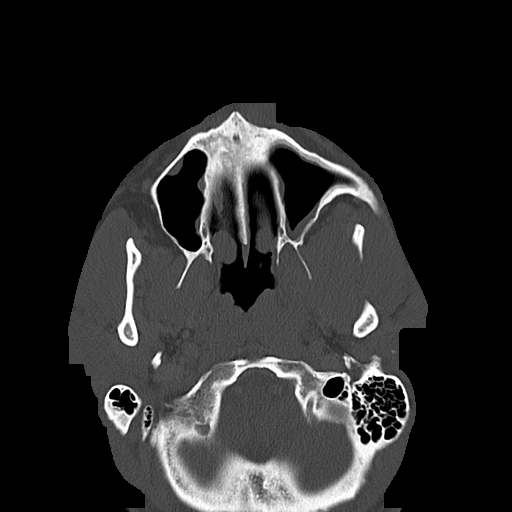
[im 34/78  bone]
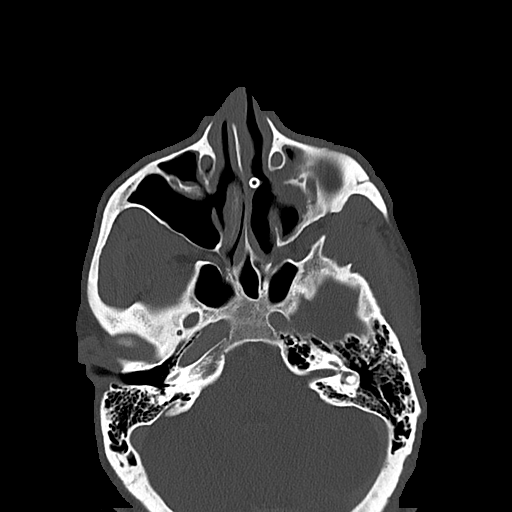
[im 39/78  brain]
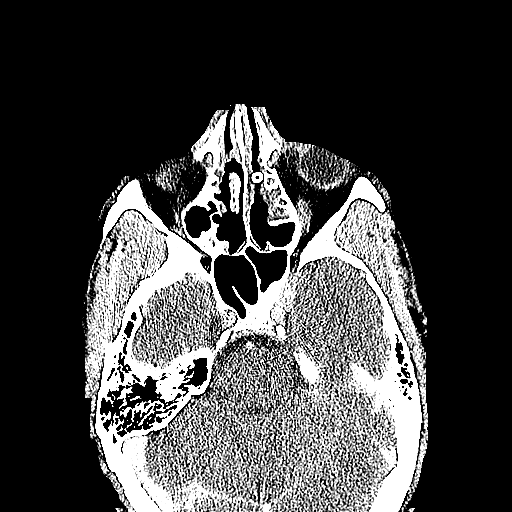
[im 39/78  bone]
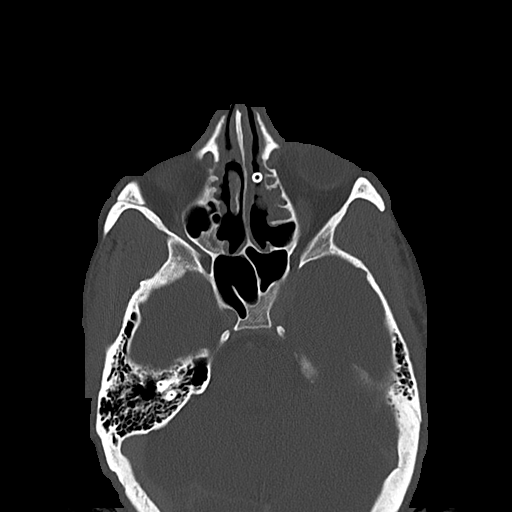
[im 45/78  bone]
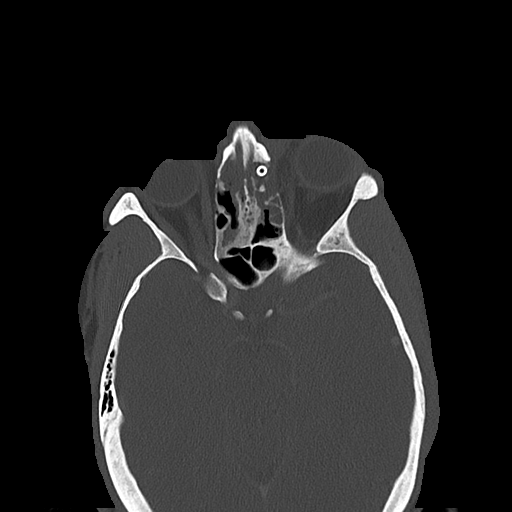
[im 56/78  bone]
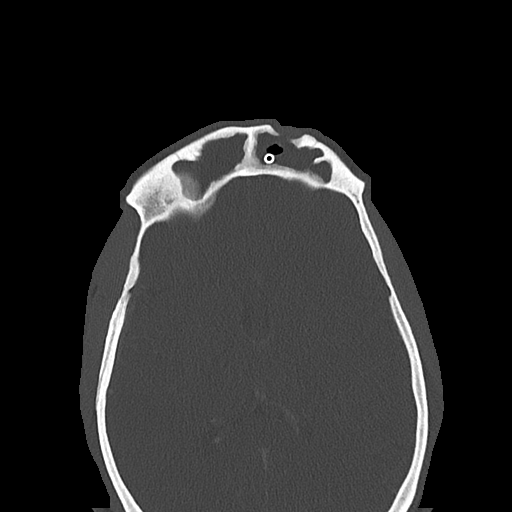
[im 61/78  bone]
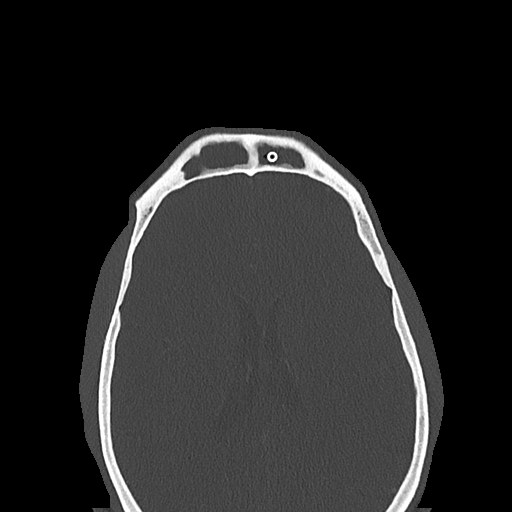
[im 72/78  brain]
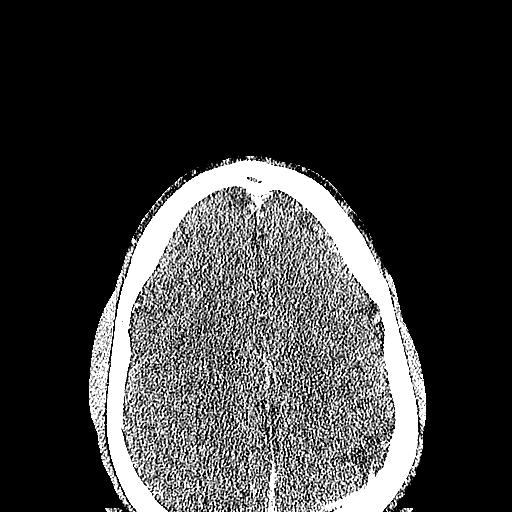
[im 72/78  bone]
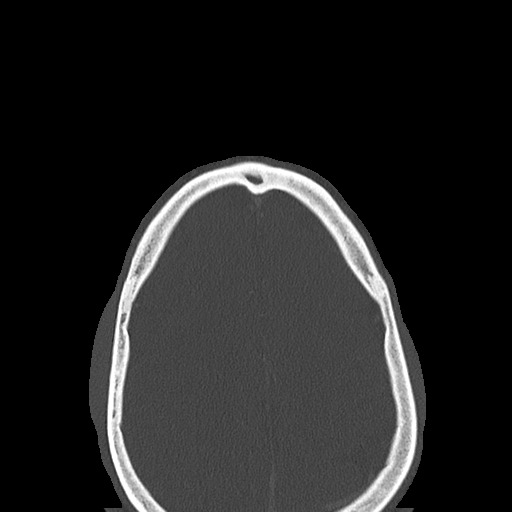

[Series 6: maxillofacial 2.00 hr60 s3 cor · coronal · 0.31mm/px · 3 of 107 slices shown]
[im 27/107  bone]
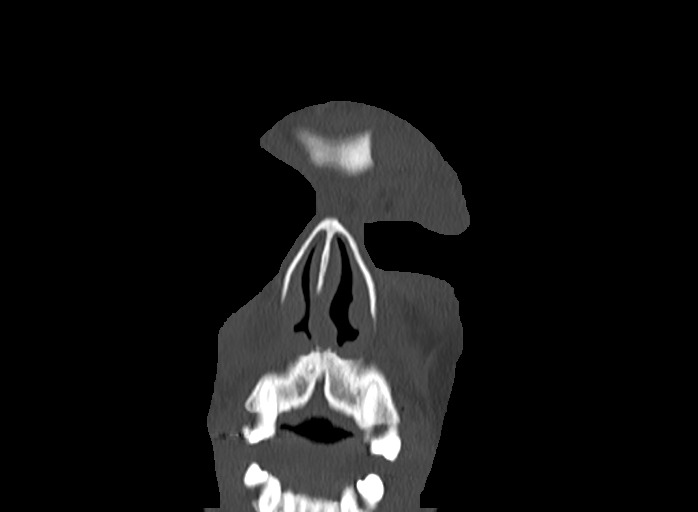
[im 54/107  bone]
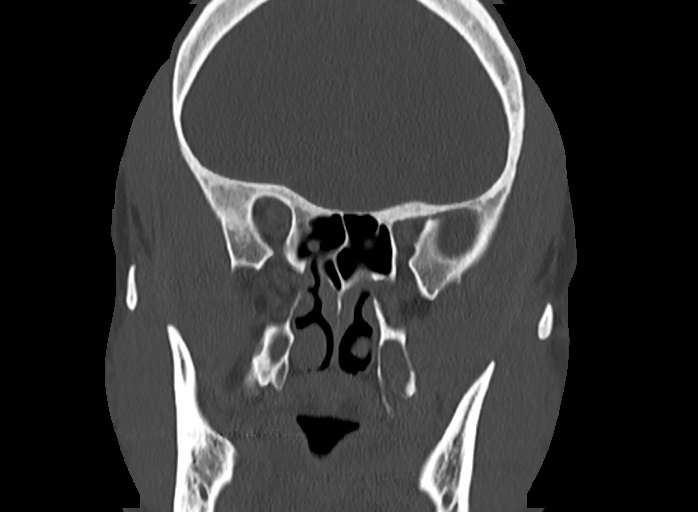
[im 80/107  bone]
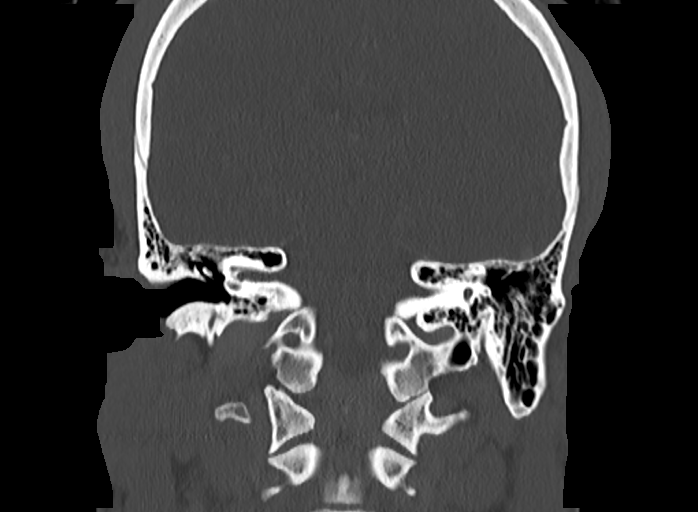

[Series 12: maxillofacial 2.00 hr40 s3 sag · sagittal · 0.31mm/px · 2 of 107 slices shown]
[im 36/107  bone]
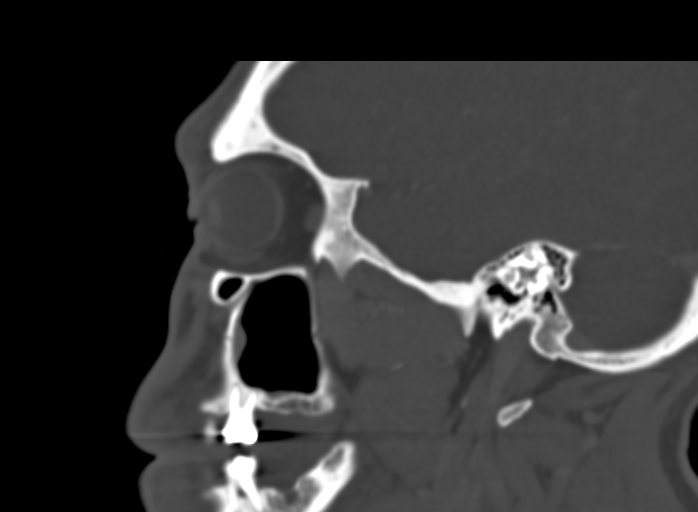
[im 71/107  bone]
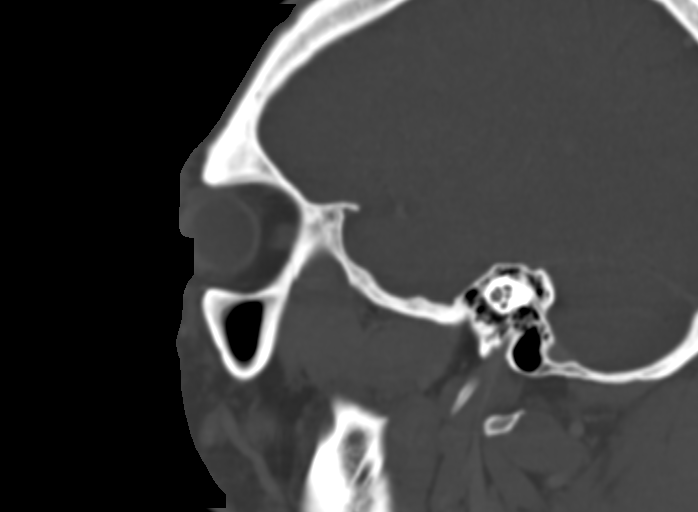

[14 of 47 positions shown; findings below may reference images not displayed]

FINDINGS: Osseous: There are postoperative changes including left frontal
sinus osteotomies along the anterior sinus wall and lateral wall of
the frontal recess. There are defects of the left lamina papyracea.
There is an osteoma within the inferomedial left frontal sinus
extending along the outflow tract. Sinus wall thickening and
sclerosis reflecting chronic inflammation. Tooth decay with
periapical lucency about the remaining right maxillary molar and
left maxillary molars and posterior premolar. There is dehiscence of
the alveolus along the buccal surface on the left.

Orbits: As above.  No intraorbital extension.

Sinuses: A drainage catheter extends from the left frontal sinus to
the nasal cavity adjacent to the left inferior turbinate. Superior
nasal cavity is partially opacified. Left middle turbinate has been
resected. Frontal sinuses and outflow tracts are opacified. Left
greater than right ethmoid opacification. There are bilateral
maxillary antrostomies. Circumferential mucosal thickening of both
maxillary sinuses. Trace sphenoid sinus mucosal thickening near the
patent ostia.

Soft tissues: Left supraorbital and frontonasal soft tissue
swelling. There is a small focus of left frontonasal subcutaneous
air.

Limited intracranial: No abnormal enhancement.
IMPRESSION: Sinonasal postoperative and inflammatory changes as described. Of
note, there are defects of the left frontal sinus and medial orbital
walls with a drainage catheter extending from the frontal sinus to
the nasal cavity. Defects are contiguous with soft tissue swelling
and small volume air within the left periorbital and frontonasal
soft tissues. Superimposed infection is not excluded. There is no
drainable collection at this time.

## 2021-05-25 ENCOUNTER — Encounter: Payer: Self-pay | Admitting: *Deleted

## 2021-12-08 ENCOUNTER — Other Ambulatory Visit: Payer: Self-pay

## 2021-12-08 ENCOUNTER — Encounter (HOSPITAL_COMMUNITY): Payer: Self-pay

## 2021-12-08 ENCOUNTER — Ambulatory Visit (HOSPITAL_COMMUNITY)
Admission: EM | Admit: 2021-12-08 | Discharge: 2021-12-08 | Disposition: A | Discharge status: Home / Self Care | Payer: 59 | Attending: Internal Medicine | Admitting: Internal Medicine

## 2021-12-08 DIAGNOSIS — R0789 Other chest pain: Secondary | ICD-10-CM | POA: Diagnosis not present

## 2021-12-08 NOTE — Discharge Instructions (Addendum)
Try heat therapy, stretching, and ibuprofen (per bottle directions) for your chest pain
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
## 2021-12-08 NOTE — ED Triage Notes (Signed)
Pt requesting work note. States having lt chest pain x2wks, worse when waking in the mornings. States has a PCP appt next week.

## 2022-05-14 ENCOUNTER — Encounter: Payer: Self-pay | Admitting: *Deleted

## 2022-11-29 ENCOUNTER — Encounter: Payer: Self-pay | Admitting: Student

## 2022-11-29 ENCOUNTER — Ambulatory Visit: Payer: 59 | Admitting: Student

## 2022-11-29 VITALS — BP 123/83 | HR 76 | Temp 98.6°F | Ht 72.0 in | Wt 248.1 lb

## 2022-11-29 DIAGNOSIS — R030 Elevated blood-pressure reading, without diagnosis of hypertension: Secondary | ICD-10-CM

## 2022-11-29 DIAGNOSIS — J069 Acute upper respiratory infection, unspecified: Secondary | ICD-10-CM

## 2022-11-29 DIAGNOSIS — F17200 Nicotine dependence, unspecified, uncomplicated: Secondary | ICD-10-CM

## 2022-11-29 DIAGNOSIS — R7303 Prediabetes: Secondary | ICD-10-CM

## 2022-11-29 DIAGNOSIS — M722 Plantar fascial fibromatosis: Secondary | ICD-10-CM

## 2022-11-29 DIAGNOSIS — Z0000 Encounter for general adult medical examination without abnormal findings: Secondary | ICD-10-CM

## 2022-11-29 DIAGNOSIS — Z720 Tobacco use: Secondary | ICD-10-CM

## 2022-11-29 MED ORDER — VARENICLINE TARTRATE (STARTER) 0.5 MG X 11 & 1 MG X 42 PO TBPK: ORAL_TABLET | ORAL | 5 refills | Status: AC

## 2022-11-29 NOTE — Patient Instructions (Addendum)
[facility] for custom orthotics.  [place], [place]  Chantix for smoking as well as patches and gum.    Guaifenesin only for your chest congestion, In addition make sure you drink lots of fluids.

## 2022-11-30 ENCOUNTER — Encounter: Payer: Self-pay | Admitting: Student

## 2022-11-30 DIAGNOSIS — R7303 Prediabetes: Secondary | ICD-10-CM | POA: Insufficient documentation

## 2022-11-30 DIAGNOSIS — M722 Plantar fascial fibromatosis: Secondary | ICD-10-CM | POA: Insufficient documentation

## 2022-11-30 DIAGNOSIS — J069 Acute upper respiratory infection, unspecified: Secondary | ICD-10-CM | POA: Insufficient documentation

## 2022-11-30 LAB — LIPID PANEL
Chol/HDL Ratio: 4.5 ratio (ref 0.0–5.0)
Cholesterol, Total: 152 mg/dL (ref 100–199)
HDL: 34 mg/dL — ABNORMAL LOW (ref >39)
LDL Chol Calc (NIH): 80 mg/dL (ref 0–99)
Triglycerides: 228 mg/dL — ABNORMAL HIGH (ref 0–149)
VLDL Cholesterol Cal: 38 mg/dL (ref 5–40)

## 2022-11-30 LAB — HEMOGLOBIN A1C
Est. average glucose Bld gHb Est-mCnc: 128 mg/dL
Hgb A1c MFr Bld: 6.1 % — ABNORMAL HIGH (ref 4.8–5.6)

## 2022-11-30 LAB — HEPATITIS C ANTIBODY: Hep C Virus Ab: NONREACTIVE

## 2022-11-30 NOTE — Progress Notes (Signed)
   CC: F/u for diabetes and HTN.    HPI:  [name] is a 42 y.o. M with PMH per below who presents for an annual wellness visit for his employer.   Patient states that he has had some persistent chest congestion since having a viral URI about a week ago. He also states that he has had some pain in the soles of his feet, which he attributes to having to use steel toe shoes and being on his feet for several hours.   Patient is also interested in smoking cessation. He has tried in the past but notes that now that he has counseling support through his company he would like to see if he his more successful with medications.   Past Medical History:  Diagnosis Date   GERD (gastroesophageal reflux disease)    Prediabetes    Sinusitis, chronic    Wears glasses    Review of Systems:  Negative except per above.   Physical Exam:  Vitals:   11/29/22 0834  BP: 123/83  Pulse: 76  Temp: 98.6 F (37 C)  TempSrc: Oral  Weight: 248 lb 1.6 oz (112.5 kg)  Height: 6' (1.829 m)   Constitutional: Well-developed, well-nourished, and in no distress.  Cardiovascular: Normal rate, regular rhythm, intact distal pulses. No gallop and no friction rub.  No murmur heard. No lower extremity edema  Pulmonary: Non labored breathing on room air, no wheezing or rales  Abdominal: Soft. Normal bowel sounds. Non distended and non tender Musculoskeletal: Normal range of motion.        General: No tenderness or edema.  Neurological: Alert and oriented to person, place, and time. Non focal  Skin: Skin is warm and dry.    Assessment & Plan:   See Encounters Tab for problem based charting.  Patient discussed with Dr. Heber [place]

## 2022-11-30 NOTE — Assessment & Plan Note (Signed)
A1c is 6.1. Patient will continue with diet and exercise.   His ASCVD risk score is 5.4%

## 2022-11-30 NOTE — Assessment & Plan Note (Signed)
Patient would like to try chantix again and will also take advantage of counseling program at his job.

## 2022-11-30 NOTE — Assessment & Plan Note (Signed)
Patient likely has plantar fasciitis. Referred patient to [facility] for custom orthotics.

## 2022-11-30 NOTE — Assessment & Plan Note (Signed)
Patient is near goal. He would like to try smoking cessation and weight loss before medications.

## 2022-11-30 NOTE — Assessment & Plan Note (Signed)
Symptoms appear to be resolving, but patient continues to have some chest congestion. We discussed using mucinex, saline sprays, and staying hydrated.

## 2022-12-01 NOTE — Progress Notes (Signed)
Internal Medicine Clinic Attending  Case discussed with the resident at the time of the visit.  We reviewed the resident's history and exam and pertinent patient test results.  I agree with the assessment, diagnosis, and plan of care documented in the resident's note.  

## 2024-05-13 ENCOUNTER — Emergency Department (HOSPITAL_COMMUNITY)
Admission: EM | Admit: 2024-05-13 | Discharge: 2024-05-13 | Disposition: A | Discharge status: Home / Self Care | Payer: Self-pay | Attending: Emergency Medicine | Admitting: Emergency Medicine

## 2024-05-13 ENCOUNTER — Other Ambulatory Visit: Payer: Self-pay

## 2024-05-13 ENCOUNTER — Encounter (HOSPITAL_COMMUNITY): Payer: Self-pay | Admitting: Emergency Medicine

## 2024-05-13 DIAGNOSIS — J0100 Acute maxillary sinusitis, unspecified: Secondary | ICD-10-CM | POA: Insufficient documentation

## 2024-05-13 LAB — RESP PANEL BY RT-PCR (RSV, FLU A&B, COVID)  RVPGX2
Influenza A by PCR: NEGATIVE
Influenza B by PCR: NEGATIVE
Resp Syncytial Virus by PCR: NEGATIVE
SARS Coronavirus 2 by RT PCR: NEGATIVE

## 2024-05-13 MED ORDER — AMOXICILLIN-POT CLAVULANATE 875-125 MG PO TABS: 1.0000 | ORAL_TABLET | Freq: Two times a day (BID) | ORAL | 0 refills | Status: AC

## 2024-05-13 MED ORDER — AMOXICILLIN-POT CLAVULANATE 875-125 MG PO TABS
1.0000 | ORAL_TABLET | Freq: Once | ORAL | Status: AC
  Administered 2024-05-13: 1 via ORAL
  Filled 2024-05-13: qty 1

## 2024-05-13 MED ORDER — METHYLPREDNISOLONE SODIUM SUCC 125 MG IJ SOLR
125.0000 mg | Freq: Once | INTRAMUSCULAR | Status: AC
  Administered 2024-05-13: 125 mg via INTRAVENOUS
  Filled 2024-05-13: qty 2

## 2024-05-13 MED ORDER — SODIUM CHLORIDE 0.9 % IV SOLN
12.5000 mg | Freq: Once | INTRAVENOUS | Status: AC
  Administered 2024-05-13: 12.5 mg via INTRAVENOUS
  Filled 2024-05-13: qty 12.5

## 2024-05-13 MED ORDER — KETOROLAC TROMETHAMINE 15 MG/ML IJ SOLN
15.0000 mg | Freq: Once | INTRAMUSCULAR | Status: AC
  Administered 2024-05-13: 15 mg via INTRAVENOUS
  Filled 2024-05-13: qty 1

## 2024-05-13 MED ORDER — SODIUM CHLORIDE 0.9 % IV BOLUS
1000.0000 mL | Freq: Once | INTRAVENOUS | Status: AC
  Administered 2024-05-13: 1000 mL via INTRAVENOUS

## 2024-05-13 NOTE — ED Triage Notes (Signed)
 Patient c/o flu like symptoms x 3 days. Patient report taking PRN OTC Congestion and cough medicine without relief. Patient report 8/10 headache. Patient report nausea , denies vomiting.

## 2024-05-13 NOTE — ED Provider Notes (Signed)
 [place] EMERGENCY DEPARTMENT AT [facility] Provider Note   CSN: [id number] Arrival date & time: 05/13/24  1530     Patient presents with: Flu like symptoms    Rodney Johnson is a 43 y.o. male.   Patient is a 43 year old male with previous history of left sided chronic sinusitis with endoscopic combined frontal sinus surgery by Dr. Jesus with ENT in 2021 presenting for right sided maxillary and frontal sinus pain and severe headache.  Patient states he began having a severe headache over the last 3 days and pain level is currently 8 out of 10 located on the right frontal, right orbital, and right maxillary regions.  He admits to photophobia to bright light exposure.  He admits to blurred vision.  Mitts to nasal congestion and rhinorrhea.  Denies fevers.  He denies any recent falls or blunt head trauma.  Denies any other logical changes including no sensation or motor deficits.  No speech changes or facial asymmetry.  The history is provided by the patient. No language interpreter was used.       Prior to Admission medications   Medication Sig Start Date End Date Taking? Authorizing Provider  amoxicillin -clavulanate (AUGMENTIN ) 875-125 MG tablet Take 1 tablet by mouth every 12 (twelve) hours for 7 days. 05/13/24 05/20/24 Yes Rodney Hila P, DO  Varenicline  Tartrate, Starter, (CHANTIX  STARTING MONTH PAK) 0.5 MG X 11 & 1 MG X 42 TBPK 0.5 MG tablet for 11 days & 1 MG  tablet for 42 days 11/29/22   Rodney Johnson    Allergies: Patient has no known allergies.    Review of Systems  Constitutional:  Negative for chills and fever.  HENT:  Positive for congestion, postnasal drip, rhinorrhea, sinus pressure and sinus pain. Negative for ear pain and sore throat.   Eyes:  Positive for photophobia and visual disturbance. Negative for pain.  Respiratory:  Negative for cough and shortness of breath.   Cardiovascular:  Negative for chest pain and palpitations.  Gastrointestinal:   Negative for abdominal pain and vomiting.  Genitourinary:  Negative for dysuria and hematuria.  Musculoskeletal:  Negative for arthralgias and back pain.  Skin:  Negative for color change and rash.  Neurological:  Negative for seizures and syncope.  All other systems reviewed and are negative.   Updated Vital Signs BP (!) 135/92 (BP Location: Left Arm)   Pulse 60   Temp 98.2 F (36.8 C) (Oral)   Resp 16   SpO2 99%   Physical Exam Vitals and nursing note reviewed.  Constitutional:      General: He is not in acute distress.    Appearance: He is well-developed.  HENT:     Head: Normocephalic and atraumatic.     Comments: Tenderness to palpation of maxillary and frontal sinuses bilaterally    Nose: Congestion and rhinorrhea present.   Eyes:     General: Vision grossly intact.     Extraocular Movements: Extraocular movements intact.     Conjunctiva/sclera:     Right eye: Right conjunctiva is injected.     Left eye: Left conjunctiva is injected.    Cardiovascular:     Rate and Rhythm: Normal rate and regular rhythm.     Heart sounds: No murmur heard. Pulmonary:     Effort: Pulmonary effort is normal. No respiratory distress.     Breath sounds: Normal breath sounds.  Abdominal:     Palpations: Abdomen is soft.     Tenderness: There is no  abdominal tenderness.   Musculoskeletal:        General: No swelling.     Cervical back: Neck supple.   Skin:    General: Skin is warm and dry.     Capillary Refill: Capillary refill takes less than 2 seconds.   Neurological:     Mental Status: He is alert.   Psychiatric:        Mood and Affect: Mood normal.     (all labs ordered are listed, but only abnormal results are displayed) Labs Reviewed  RESP PANEL BY RT-PCR (RSV, FLU A&B, COVID)  [id number]    EKG: None  Radiology: No results found.   Procedures   Medications Ordered in the ED  sodium chloride  0.9 % bolus 1,000 mL (1,000 mLs Intravenous New Bag/Given 05/13/24  1744)  methylPREDNISolone  sodium succinate (SOLU-MEDROL ) 125 mg/2 mL injection 125 mg (125 mg Intravenous Given 05/13/24 1739)  ketorolac  (TORADOL ) 15 MG/ML injection 15 mg (15 mg Intravenous Given 05/13/24 1740)  promethazine  (PHENERGAN ) 12.5 mg in sodium chloride  0.9 % 50 mL IVPB (0 mg Intravenous Stopped 05/13/24 1805)  amoxicillin -clavulanate (AUGMENTIN ) 875-125 MG per tablet 1 tablet (1 tablet Oral Given 05/13/24 1740)                                    Medical Decision Making Risk Prescription drug management.   31:51 PM 43 year old male with previous history of left sided chronic sinusitis with endoscopic combined frontal sinus surgery by Dr. Jesus with ENT in 2021 presenting for right sided maxillary and frontal sinus pain and severe headache.  Patient is alert and oriented x 3, no acute distress, afebrile, stable vital signs.  Physical exam demonstrates no neurological dysfunction.  Differential diagnosis includes but is not limited to viral URI, sinusitis, migraine, etc.    Patient treated with Phenergan , Solu-Medrol , and IV fluids for management of headache.  Patient's sinusitis possibly secondary to viral etiology however due to his history of chronic sinusitis including sinus surgery will cover with Augmentin  for bacterial sinusitis.  On reevaluation patient has significant improvement of headache.  Discharged home with a 7-day prescription of Augmentin .  Recommended to follow-up with his ENT specialist if he continues to have symptoms after antibiotic treatment.  Patient in no distress and overall condition improved here in the ED. Detailed discussions were had with the patient regarding current findings, and need for close f/u with PCP or on call doctor. The patient has been instructed to return immediately if the symptoms worsen in any way for re-evaluation. Patient verbalized understanding and is in agreement with current care plan. All questions answered prior to discharge.       Final diagnoses:  Acute non-recurrent maxillary sinusitis    ED Discharge Orders          Ordered    amoxicillin -clavulanate (AUGMENTIN ) 875-125 MG tablet  Every 12 hours        05/13/24 1821               Rodney Bernarda SQUIBB, DO 05/13/24 [place]

## 2024-05-15 ENCOUNTER — Other Ambulatory Visit: Payer: Self-pay

## 2024-05-15 ENCOUNTER — Emergency Department (HOSPITAL_COMMUNITY): Payer: Self-pay

## 2024-05-15 ENCOUNTER — Emergency Department (HOSPITAL_COMMUNITY)
Admission: EM | Admit: 2024-05-15 | Discharge: 2024-05-15 | Disposition: A | Discharge status: Home / Self Care | Payer: Self-pay

## 2024-05-15 DIAGNOSIS — J0101 Acute recurrent maxillary sinusitis: Secondary | ICD-10-CM | POA: Insufficient documentation

## 2024-05-15 LAB — CBC WITH DIFFERENTIAL/PLATELET
Abs Immature Granulocytes: 0.05 K/uL (ref 0.00–0.07)
Basophils Absolute: 0.1 K/uL (ref 0.0–0.1)
Basophils Relative: 0 %
Eosinophils Absolute: 0 K/uL (ref 0.0–0.5)
Eosinophils Relative: 0 %
HCT: 48.9 % (ref 39.0–52.0)
Hemoglobin: 16.2 g/dL (ref 13.0–17.0)
Immature Granulocytes: 0 %
Lymphocytes Relative: 34 %
Lymphs Abs: 5.2 K/uL — ABNORMAL HIGH (ref 0.7–4.0)
MCH: 29.3 pg (ref 26.0–34.0)
MCHC: 33.1 g/dL (ref 30.0–36.0)
MCV: 88.6 fL (ref 80.0–100.0)
Monocytes Absolute: 1 K/uL (ref 0.1–1.0)
Monocytes Relative: 7 %
Neutro Abs: 8.8 K/uL — ABNORMAL HIGH (ref 1.7–7.7)
Neutrophils Relative %: 59 %
Platelets: 296 K/uL (ref 150–400)
RBC: 5.52 MIL/uL (ref 4.22–5.81)
RDW: 13.2 % (ref 11.5–15.5)
WBC: 15.2 K/uL — ABNORMAL HIGH (ref 4.0–10.5)
nRBC: 0 % (ref 0.0–0.2)

## 2024-05-15 LAB — I-STAT CHEM 8, ED
BUN: 13 mg/dL (ref 6–20)
Calcium, Ion: 1.09 mmol/L — ABNORMAL LOW (ref 1.15–1.40)
Chloride: 102 mmol/L (ref 98–111)
Creatinine, Ser: 1.1 mg/dL (ref 0.61–1.24)
Glucose, Bld: 105 mg/dL — ABNORMAL HIGH (ref 70–99)
HCT: 48 % (ref 39.0–52.0)
Hemoglobin: 16.3 g/dL (ref 13.0–17.0)
Potassium: 3.9 mmol/L (ref 3.5–5.1)
Sodium: 140 mmol/L (ref 135–145)
TCO2: 24 mmol/L (ref 22–32)

## 2024-05-15 MED ORDER — PROCHLORPERAZINE EDISYLATE 10 MG/2ML IJ SOLN
10.0000 mg | Freq: Once | INTRAMUSCULAR | Status: AC
  Administered 2024-05-15: 10 mg via INTRAVENOUS
  Filled 2024-05-15: qty 2

## 2024-05-15 MED ORDER — METHYLPREDNISOLONE 4 MG PO TBPK: ORAL_TABLET | ORAL | 0 refills | Status: DC

## 2024-05-15 MED ORDER — IOHEXOL 350 MG/ML SOLN
75.0000 mL | Freq: Once | INTRAVENOUS | Status: AC | PRN
  Administered 2024-05-15: 75 mL via INTRAVENOUS

## 2024-05-15 MED ORDER — HYDROCODONE-ACETAMINOPHEN 5-325 MG PO TABS: 1.0000 | ORAL_TABLET | Freq: Four times a day (QID) | ORAL | 0 refills | Status: AC | PRN

## 2024-05-15 MED ORDER — KETOROLAC TROMETHAMINE 15 MG/ML IJ SOLN
15.0000 mg | Freq: Once | INTRAMUSCULAR | Status: AC
  Administered 2024-05-15: 15 mg via INTRAVENOUS
  Filled 2024-05-15: qty 1

## 2024-05-15 MED ORDER — LEVOFLOXACIN 750 MG PO TABS
750.0000 mg | ORAL_TABLET | Freq: Once | ORAL | Status: AC
  Administered 2024-05-15: 750 mg via ORAL
  Filled 2024-05-15: qty 1

## 2024-05-15 MED ORDER — FLUTICASONE PROPIONATE 50 MCG/ACT NA SUSP: 1.0000 | Freq: Every day | NASAL | 2 refills | Status: DC

## 2024-05-15 MED ORDER — CETIRIZINE HCL 10 MG PO TABS: 10.0000 mg | ORAL_TABLET | Freq: Every day | ORAL | 0 refills | Status: AC

## 2024-05-15 MED ORDER — DIPHENHYDRAMINE HCL 50 MG/ML IJ SOLN
50.0000 mg | Freq: Once | INTRAMUSCULAR | Status: AC
  Administered 2024-05-15: 50 mg via INTRAVENOUS
  Filled 2024-05-15: qty 1

## 2024-05-15 MED ORDER — METHYLPREDNISOLONE SODIUM SUCC 125 MG IJ SOLR
125.0000 mg | Freq: Once | INTRAMUSCULAR | Status: AC
  Administered 2024-05-15: 125 mg via INTRAVENOUS
  Filled 2024-05-15: qty 2

## 2024-05-15 MED ORDER — LEVOFLOXACIN 750 MG PO TABS: 750.0000 mg | ORAL_TABLET | Freq: Every day | ORAL | 0 refills | Status: AC

## 2024-05-15 NOTE — ED Provider Notes (Signed)
 [place] EMERGENCY DEPARTMENT AT [facility] Provider Note   CSN: [id number] Arrival date & time: 05/15/24  9088     Patient presents with: Facial Pain   Rodney Johnson is a 43 y.o. male.   43 year old male with past medical history of sinus surgery on the left in the past presenting to the emergency department today with worsening right sided sinus and facial pain and headache.  The patient states that he does have a history of migraine headaches as well and is having what he describes as a persistent migraine on the right side.  He states that he has had some intermittent flashing of lights when he closes his eyes which he has had with previous migraines.  He denies any fevers but has had some chills.  Reports he is having pain and pressure over the right side.  He was started on Augmentin  2 days ago and has been taking this as prescribed with mild improvement.        Prior to Admission medications   Medication Sig Start Date End Date Taking? Authorizing Provider  cetirizine  (ZYRTEC  ALLERGY) 10 MG tablet Take 1 tablet (10 mg total) by mouth daily. 05/15/24  Yes Ula Prentice SAUNDERS, MD  fluticasone  (FLONASE ) 50 MCG/ACT nasal spray Place 1 spray into both nostrils daily. 05/15/24  Yes Ula Prentice SAUNDERS, MD  HYDROcodone -acetaminophen  (NORCO/VICODIN) 5-325 MG tablet Take 1 tablet by mouth every 6 (six) hours as needed for moderate pain (pain score 4-6) or severe pain (pain score 7-10). 05/15/24  Yes Ula Prentice SAUNDERS, MD  levofloxacin (LEVAQUIN) 750 MG tablet Take 1 tablet (750 mg total) by mouth daily. 05/15/24  Yes Ula Prentice SAUNDERS, MD  methylPREDNISolone  (MEDROL  DOSEPAK) 4 MG TBPK tablet Take as directed 05/15/24  Yes Ula Prentice SAUNDERS, MD  amoxicillin -clavulanate (AUGMENTIN ) 875-125 MG tablet Take 1 tablet by mouth every 12 (twelve) hours for 7 days. 05/13/24 05/20/24  Elnor Bernarda SQUIBB, DO  Varenicline  Tartrate, Starter, (CHANTIX  STARTING MONTH PAK) 0.5 MG X 11 & 1 MG X 42 TBPK 0.5 MG tablet for 11  days & 1 MG  tablet for 42 days 11/29/22   Franchot Novel, MD    Allergies: Patient has no known allergies.    Review of Systems  HENT:  Positive for sinus pressure and sinus pain.   Neurological:  Positive for headaches.  All other systems reviewed and are negative.   Updated Vital Signs BP 128/87   Pulse (!) 56   Temp 99.3 F (37.4 C) (Oral)   Resp 18   Ht 6' (1.829 m)   Wt 81.6 kg   SpO2 100%   BMI 24.41 kg/m   Physical Exam Vitals and nursing note reviewed.   Gen: NAD Eyes: PERRL, EOMI HEENT: no oropharyngeal swelling Neck: trachea midline, no meningismus Resp: clear to auscultation bilaterally Card: RRR, no murmurs, rubs, or gallops Abd: nontender, nondistended Extremities: no calf tenderness, no edema Vascular: 2+ radial pulses bilaterally, 2+ DP pulses bilaterally Neuro: No focal deficits Skin: no rashes Psyc: acting appropriately   (all labs ordered are listed, but only abnormal results are displayed) Labs Reviewed  CBC WITH DIFFERENTIAL/PLATELET - Abnormal; Notable for the following components:      Result Value   WBC 15.2 (*)    Neutro Abs 8.8 (*)    Lymphs Abs 5.2 (*)    All other components within normal limits  I-STAT CHEM 8, ED - Abnormal; Notable for the following components:   Glucose, Bld 105 (*)  Calcium, Ion 1.09 (*)    All other components within normal limits    EKG: None  Radiology: CT Head Wo Contrast Result Date: 05/15/2024 CLINICAL DATA:  Headache, increasing frequency or severity EXAM: CT HEAD WITHOUT CONTRAST TECHNIQUE: Contiguous axial images were obtained from the base of the skull through the vertex without intravenous contrast. RADIATION DOSE REDUCTION: This exam was performed according to the departmental dose-optimization program which includes automated exposure control, adjustment of the mA and/or kV according to patient size and/or use of iterative reconstruction technique. COMPARISON:  CT of the head dated September 14, 2019. FINDINGS: Brain: Normal brain. No evidence of hemorrhage, mass, cortical infarct or hydrocephalus. Vascular: Negative. Skull: Status post osteotomy of the ventral wall of the left frontal sinus. Sinuses/Orbits: Near complete opacification of the frontal and anterior ethmoid sinuses. Circumferential mucosal disease within the maxillary sinuses. Other: None. IMPRESSION: 1. Normal brain. 2. Paranasal sinus disease. Electronically Signed   By: Evalene Coho M.D.   On: 05/15/2024 12:36   CT Maxillofacial W Contrast Result Date: 05/15/2024 CLINICAL DATA:  prior hx of cellulitis of sinus requiring urgent symptoms now with recurrent sx EXAM: CT MAXILLOFACIAL WITH CONTRAST TECHNIQUE: Multidetector CT imaging of the maxillofacial structures was performed with intravenous contrast. Multiplanar CT image reconstructions were also generated. RADIATION DOSE REDUCTION: This exam was performed according to the departmental dose-optimization program which includes automated exposure control, adjustment of the mA and/or kV according to patient size and/or use of iterative reconstruction technique. CONTRAST:  75mL OMNIPAQUE  IOHEXOL  350 MG/ML SOLN COMPARISON:  Maxillofacial CT dated August 21, 2020. FINDINGS: Osseous: Osteotomy defect involving the anteromedial wall of the left frontal sinus is again demonstrated. 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 Status post left ethmoidectomies, middle turbinectomy and left antrostomy. No acute bony abnormality. Orbits: Left super orbital soft tissue thickening and enhancement. The globes are unremarkable. There are no postseptal inflammatory changes present. Sinuses: Complete opacification of the frontal sinuses and anterior ethmoid air cells. There is circumferential mucosal disease within the floors of the maxillary sinuses. There also polypoid densities present. The sphenoid sinuses and mastoid air cells are clear. Soft tissues: There appears to  be mild enhancement of the soft tissues overlying the left frontal sinus, although this may represent scar tissue. Limited intracranial: Unremarkable. IMPRESSION: 1. Complete opacification of the frontal sinuses and anterior ethmoid air cells status post osteotomy of the anterior wall of the left frontal sinus. There is questionable inflammatory enhancement overlying the osteotomy site, although no fluid collection. 2. Status post left ethmoidectomy, middle turbinectomy and antrostomy. Electronically Signed   By: Evalene Coho M.D.   On: 05/15/2024 12:17     Procedures   Medications Ordered in the ED  methylPREDNISolone  sodium succinate (SOLU-MEDROL ) 125 mg/2 mL injection 125 mg (has no administration in time range)  levofloxacin (LEVAQUIN) tablet 750 mg (has no administration in time range)  prochlorperazine (COMPAZINE) injection 10 mg (10 mg Intravenous Given 05/15/24 1030)  diphenhydrAMINE (BENADRYL) injection 50 mg (50 mg Intravenous Given 05/15/24 1030)  iohexol  (OMNIPAQUE ) 350 MG/ML injection 75 mL (75 mLs Intravenous Contrast Given 05/15/24 1125)  ketorolac  (TORADOL ) 15 MG/ML injection 15 mg (15 mg Intravenous Given 05/15/24 1302)                                    Medical Decision Making 43 year old male with past medical history of  migraine headaches and sinus surgery in the past presenting to the emergency department today with persistent sinus pain and headache.  I will further evaluate the patient here with basic labs as well as a CT scan of his sinuses for further evaluation for complications as he does report that he still does have some drains in place.  Will give the patient Compazine and Benadryl for his headache as well as IV fluids.  He does not have any symptoms of meningismus on exam here.  Will reevaluate for ultimate disposition.  The patient's labs are largely unremarkable with exception of a leukocytosis.  CT scan does show pansinusitis.  Given his worsening symptoms  despite the Augmentin  I discussed his case with Dr. Deanie who recommended switching the patient to Levaquin and to start him on a Medrol  Dosepak.  Recommended Flonase  as well as oral antihistamines as well.  The patient is feeling better on reassessment.  He is given his first dose of antibiotics here and is discharged with return precautions.  Amount and/or Complexity of Data Reviewed Radiology: ordered.  Risk OTC drugs. Prescription drug management.        Final diagnoses:  Acute recurrent maxillary sinusitis    ED Discharge Orders          Ordered    levofloxacin (LEVAQUIN) 750 MG tablet  Daily        05/15/24 1334    methylPREDNISolone  (MEDROL  DOSEPAK) 4 MG TBPK tablet        05/15/24 1334    fluticasone  (FLONASE ) 50 MCG/ACT nasal spray  Daily        05/15/24 1334    cetirizine  (ZYRTEC  ALLERGY) 10 MG tablet  Daily        05/15/24 1334    HYDROcodone -acetaminophen  (NORCO/VICODIN) 5-325 MG tablet  Every 6 hours PRN        05/15/24 1334               Ula Prentice SAUNDERS, MD 05/15/24 1336

## 2024-05-15 NOTE — ED Notes (Signed)
 Patient transported to CT

## 2024-05-15 NOTE — Discharge Instructions (Signed)
 I spoke with the ENT doctors on-call.  They recommended switching your antibiotic to the Levaquin.  Please stop the Augmentin  and start the Levaquin.  Start the steroids starting tomorrow.  Use the Zyrtec  and Flonase  and do your nasal rinses 3 times per day.  Call to schedule a follow-up appointment with Dr. Jesus.  Take 800 mg of ibuprofen  every 6 hours as needed for pain.  If you are having persistent pain despite this take the Norco as needed for pain.  Do not drive or drink alcohol while taking this as it may make you drowsy.

## 2024-05-15 NOTE — ED Notes (Signed)
 EDP at bedside

## 2024-05-15 NOTE — ED Triage Notes (Signed)
 Pt started having severe sinus pain and pressure 4 days ago. Pt has been on abx for 2 days with no improvement. Pt believes there is a blockage in sinuses. This feels similar to last time that happened. No cough or sob.

## 2024-08-22 ENCOUNTER — Ambulatory Visit: Admission: EM | Admit: 2024-08-22 | Discharge: 2024-08-22 | Disposition: A | Discharge status: Home / Self Care

## 2024-08-22 DIAGNOSIS — J0140 Acute pansinusitis, unspecified: Secondary | ICD-10-CM | POA: Diagnosis not present

## 2024-08-22 MED ORDER — PREDNISONE 20 MG PO TABS: 40.0000 mg | ORAL_TABLET | Freq: Every day | ORAL | 0 refills | Status: AC

## 2024-08-22 MED ORDER — AMOXICILLIN-POT CLAVULANATE 875-125 MG PO TABS: 1.0000 | ORAL_TABLET | Freq: Two times a day (BID) | ORAL | 0 refills | Status: DC

## 2024-08-22 MED ORDER — AZELASTINE HCL 0.1 % NA SOLN: 1.0000 | Freq: Two times a day (BID) | NASAL | 1 refills | Status: AC
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
## 2024-08-22 NOTE — Discharge Instructions (Addendum)
  1. Acute non-recurrent pansinusitis (Primary) - amoxicillin -clavulanate (AUGMENTIN ) 875-125 MG tablet; Take 1 tablet by mouth every 12 (twelve) hours.  Dispense: 14 tablet; Refill: 0 - predniSONE (DELTASONE) 20 MG tablet; Take 2 tablets (40 mg total) by mouth daily for 5 days.  Dispense: 10 tablet; Refill: 0 - azelastine (ASTELIN) 0.1 % nasal spray; Place 1 spray into both nostrils 2 (two) times daily. Use in each nostril as directed  Dispense: 30 mL; Refill: 1 -Continue to monitor symptoms for any change in severity if there is any escalation of current symptoms or development of new symptoms follow-up in ER or return to UC for further evaluation and management.

## 2024-08-22 NOTE — ED Triage Notes (Signed)
 Patient reports recurrence of sinus symptoms, including left-sided headache, sinus pressure, dizziness, and photophobia. Symptoms began approximately 2 days ago. Denies fever, rhinorrhea, or cough. Also, for the last few months having to dig in right ear. Established care with PCP at [facility].

## 2024-10-21 ENCOUNTER — Encounter: Payer: Self-pay | Admitting: Emergency Medicine

## 2024-10-21 ENCOUNTER — Ambulatory Visit
Admission: EM | Admit: 2024-10-21 | Discharge: 2024-10-21 | Disposition: A | Discharge status: Home / Self Care | Attending: Emergency Medicine | Admitting: Emergency Medicine

## 2024-10-21 DIAGNOSIS — J0101 Acute recurrent maxillary sinusitis: Secondary | ICD-10-CM | POA: Diagnosis not present

## 2024-10-21 MED ORDER — AMOXICILLIN-POT CLAVULANATE 875-125 MG PO TABS: 1.0000 | ORAL_TABLET | Freq: Two times a day (BID) | ORAL | 0 refills | Status: AC

## 2024-10-21 NOTE — ED Triage Notes (Addendum)
 Pt reports sinus pain, pressure, and congestion x1 week. Pt reports the sinus pain is primarily L-sided. Has been using extra strength tylenol  for sinus headaches prn and using astelin  nasal spray BID with some relief. Pt had pansinusitis in early Oct. Hx of chronic sinus infections. Also planning to see ENT in Jan 2026. Denies fevers or other associated symptoms.
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
## 2024-10-21 NOTE — Discharge Instructions (Signed)
 Take the antibiotics twice daily with food for the next 10 days.  Take all antibiotics until you run out.  Continue with the nasal spray.  I suggest a sinus rinse, Nettie pot is available over-the-counter.  Use filtered water .  Follow-up with ENT as scheduled.  You should be feeling better with the antibiotics, if you do not have any improvement in symptoms or develop new concerning symptoms return to clinic or seek immediate care at the nearest emergency department.
